# Patient Record
Sex: Male | Born: 1935 | Race: Black or African American | Hispanic: No | Marital: Single | Smoking: Never smoker
Health system: Southern US, Community
[De-identification: ages and names within clinical notes are randomized; demographics above are authoritative.]

## PROBLEM LIST (undated history)

## (undated) ENCOUNTER — Emergency Department (HOSPITAL_COMMUNITY): Disposition: A | Discharge status: Still In Hospital | Payer: Medicare PPO

## (undated) DIAGNOSIS — J309 Allergic rhinitis, unspecified: Secondary | ICD-10-CM

## (undated) DIAGNOSIS — M5030 Other cervical disc degeneration, unspecified cervical region: Secondary | ICD-10-CM

## (undated) DIAGNOSIS — I10 Essential (primary) hypertension: Secondary | ICD-10-CM

## (undated) DIAGNOSIS — K219 Gastro-esophageal reflux disease without esophagitis: Secondary | ICD-10-CM

## (undated) DIAGNOSIS — F0390 Unspecified dementia without behavioral disturbance: Secondary | ICD-10-CM

## (undated) HISTORY — DX: Unspecified dementia, unspecified severity, without behavioral disturbance, psychotic disturbance, mood disturbance, and anxiety: F03.90

## (undated) HISTORY — DX: Other cervical disc degeneration, unspecified cervical region: M50.30

## (undated) HISTORY — DX: Allergic rhinitis, unspecified: J30.9

## (undated) HISTORY — DX: Essential (primary) hypertension: I10

## (undated) HISTORY — DX: Gastro-esophageal reflux disease without esophagitis: K21.9

---

## 1999-03-30 ENCOUNTER — Encounter: Admission: RE | Admit: 1999-03-30 | Discharge: 1999-03-30 | Payer: Self-pay | Admitting: Cardiology

## 1999-03-30 ENCOUNTER — Encounter: Payer: Self-pay | Admitting: Cardiology

## 1999-04-14 ENCOUNTER — Encounter: Admission: RE | Admit: 1999-04-14 | Discharge: 1999-04-14 | Payer: Self-pay | Admitting: Cardiology

## 1999-04-14 ENCOUNTER — Encounter: Payer: Self-pay | Admitting: Cardiology

## 2000-02-20 ENCOUNTER — Emergency Department (HOSPITAL_COMMUNITY): Admission: EM | Admit: 2000-02-20 | Discharge: 2000-02-20 | Payer: Self-pay | Admitting: *Deleted

## 2000-06-27 ENCOUNTER — Encounter: Payer: Self-pay | Admitting: Cardiology

## 2000-06-27 ENCOUNTER — Ambulatory Visit (HOSPITAL_COMMUNITY): Admission: RE | Admit: 2000-06-27 | Discharge: 2000-06-27 | Payer: Self-pay | Admitting: Cardiology

## 2000-07-24 ENCOUNTER — Emergency Department (HOSPITAL_COMMUNITY): Admission: EM | Admit: 2000-07-24 | Discharge: 2000-07-24 | Payer: Self-pay | Admitting: Emergency Medicine

## 2000-08-03 ENCOUNTER — Encounter: Payer: Self-pay | Admitting: Cardiology

## 2000-08-03 ENCOUNTER — Ambulatory Visit (HOSPITAL_COMMUNITY): Admission: RE | Admit: 2000-08-03 | Discharge: 2000-08-03 | Payer: Self-pay | Admitting: Cardiology

## 2000-10-03 ENCOUNTER — Encounter: Payer: Self-pay | Admitting: Gastroenterology

## 2000-10-03 ENCOUNTER — Ambulatory Visit (HOSPITAL_COMMUNITY): Admission: RE | Admit: 2000-10-03 | Discharge: 2000-10-03 | Payer: Self-pay | Admitting: Gastroenterology

## 2000-12-30 ENCOUNTER — Encounter: Payer: Self-pay | Admitting: Orthopedic Surgery

## 2001-01-04 ENCOUNTER — Ambulatory Visit (HOSPITAL_COMMUNITY): Admission: RE | Admit: 2001-01-04 | Discharge: 2001-01-04 | Payer: Self-pay | Admitting: Orthopedic Surgery

## 2002-12-13 ENCOUNTER — Encounter: Admission: RE | Admit: 2002-12-13 | Discharge: 2002-12-13 | Payer: Self-pay | Admitting: Cardiology

## 2002-12-13 ENCOUNTER — Encounter: Payer: Self-pay | Admitting: Cardiology

## 2004-07-11 ENCOUNTER — Emergency Department (HOSPITAL_COMMUNITY): Admission: EM | Admit: 2004-07-11 | Discharge: 2004-07-11 | Payer: Self-pay | Admitting: Emergency Medicine

## 2006-12-08 ENCOUNTER — Emergency Department (HOSPITAL_COMMUNITY): Admission: EM | Admit: 2006-12-08 | Discharge: 2006-12-08 | Payer: Self-pay | Admitting: Emergency Medicine

## 2006-12-31 ENCOUNTER — Emergency Department (HOSPITAL_COMMUNITY): Admission: EM | Admit: 2006-12-31 | Discharge: 2006-12-31 | Payer: Self-pay | Admitting: Emergency Medicine

## 2007-09-01 ENCOUNTER — Emergency Department (HOSPITAL_COMMUNITY): Admission: EM | Admit: 2007-09-01 | Discharge: 2007-09-01 | Payer: Self-pay | Admitting: Emergency Medicine

## 2008-03-01 ENCOUNTER — Encounter: Payer: Self-pay | Admitting: Internal Medicine

## 2008-03-01 LAB — CONVERTED CEMR LAB
AST: 33 units/L
Alkaline Phosphatase: 88 units/L
CO2: 26 meq/L
Calcium: 9 mg/dL
Glucose, Bld: 98 mg/dL
HDL: 39 mg/dL
LDL Cholesterol: 114 mg/dL
Total Protein: 7.3 g/dL
Triglyceride fasting, serum: 179 mg/dL

## 2008-06-03 ENCOUNTER — Encounter: Payer: Self-pay | Admitting: Internal Medicine

## 2008-06-03 LAB — CONVERTED CEMR LAB
HCT: 35.2 %
Hemoglobin: 12.2 g/dL
LDL Cholesterol: 135 mg/dL
MCV: 89.8 fL
Platelets: 155 10*3/uL
RBC: 3.92 M/uL
RDW: 13.7 %
WBC: 5.6 10*3/uL

## 2008-09-01 ENCOUNTER — Emergency Department (HOSPITAL_COMMUNITY): Admission: EM | Admit: 2008-09-01 | Discharge: 2008-09-01 | Payer: Self-pay | Admitting: Emergency Medicine

## 2008-10-10 ENCOUNTER — Encounter: Payer: Self-pay | Admitting: Internal Medicine

## 2008-10-10 LAB — CONVERTED CEMR LAB
ALT: 18 units/L
Albumin: 4.2 g/dL
CO2: 27 meq/L
Calcium: 9.5 mg/dL
Chloride: 105 meq/L
Creatinine, Ser: 1.17 mg/dL
HCT: 35 %
LDL Cholesterol: 123 mg/dL
MCV: 87.3 fL
RDW: 13.8 %
Total Protein: 7.3 g/dL
Triglyceride fasting, serum: 151 mg/dL
WBC: 5.1 10*3/uL

## 2008-10-24 ENCOUNTER — Emergency Department (HOSPITAL_COMMUNITY): Admission: EM | Admit: 2008-10-24 | Discharge: 2008-10-25 | Payer: Self-pay | Admitting: Emergency Medicine

## 2008-12-11 ENCOUNTER — Encounter: Admission: RE | Admit: 2008-12-11 | Discharge: 2008-12-11 | Payer: Self-pay | Admitting: Orthopedic Surgery

## 2009-03-17 ENCOUNTER — Ambulatory Visit: Payer: Self-pay | Admitting: Internal Medicine

## 2009-03-17 DIAGNOSIS — I10 Essential (primary) hypertension: Secondary | ICD-10-CM | POA: Insufficient documentation

## 2009-03-17 DIAGNOSIS — J309 Allergic rhinitis, unspecified: Secondary | ICD-10-CM | POA: Insufficient documentation

## 2009-03-19 ENCOUNTER — Encounter: Payer: Self-pay | Admitting: Internal Medicine

## 2009-05-18 ENCOUNTER — Emergency Department (HOSPITAL_COMMUNITY): Admission: EM | Admit: 2009-05-18 | Discharge: 2009-05-18 | Payer: Self-pay | Admitting: Emergency Medicine

## 2009-05-19 ENCOUNTER — Ambulatory Visit: Payer: Self-pay | Admitting: Internal Medicine

## 2009-05-19 DIAGNOSIS — R1084 Generalized abdominal pain: Secondary | ICD-10-CM

## 2009-05-19 LAB — CONVERTED CEMR LAB
ALT: 25 units/L (ref 0–53)
AST: 36 units/L (ref 0–37)
Albumin: 3.9 g/dL (ref 3.5–5.2)
Alkaline Phosphatase: 76 units/L (ref 39–117)
BUN: 11 mg/dL (ref 6–23)
Bilirubin, Direct: 0 mg/dL (ref 0.0–0.3)
CO2: 30 meq/L (ref 19–32)
Calcium: 9.3 mg/dL (ref 8.4–10.5)
Chloride: 105 meq/L (ref 96–112)
Eosinophils Relative: 3.4 % (ref 0.0–5.0)
Hemoglobin: 12 g/dL — ABNORMAL LOW (ref 13.0–17.0)
Ketones, ur: NEGATIVE mg/dL
Lymphs Abs: 2.1 10*3/uL (ref 0.7–4.0)
MCHC: 32.1 g/dL (ref 30.0–36.0)
Monocytes Relative: 12.7 % — ABNORMAL HIGH (ref 3.0–12.0)
RBC: 3.91 M/uL — ABNORMAL LOW (ref 4.22–5.81)
Total Protein: 7.8 g/dL (ref 6.0–8.3)
Urine Glucose: NEGATIVE mg/dL
Urobilinogen, UA: 0.2 (ref 0.0–1.0)
pH: 7 (ref 5.0–8.0)

## 2009-05-22 ENCOUNTER — Ambulatory Visit: Payer: Self-pay | Admitting: Internal Medicine

## 2009-06-10 ENCOUNTER — Ambulatory Visit: Payer: Self-pay | Admitting: Internal Medicine

## 2009-06-10 DIAGNOSIS — M542 Cervicalgia: Secondary | ICD-10-CM

## 2009-06-10 DIAGNOSIS — R079 Chest pain, unspecified: Secondary | ICD-10-CM

## 2009-06-25 ENCOUNTER — Ambulatory Visit: Payer: Self-pay | Admitting: Internal Medicine

## 2009-08-01 ENCOUNTER — Ambulatory Visit: Payer: Self-pay | Admitting: Internal Medicine

## 2009-08-01 DIAGNOSIS — M1990 Unspecified osteoarthritis, unspecified site: Secondary | ICD-10-CM | POA: Insufficient documentation

## 2009-08-01 DIAGNOSIS — R634 Abnormal weight loss: Secondary | ICD-10-CM | POA: Insufficient documentation

## 2009-08-01 DIAGNOSIS — R1013 Epigastric pain: Secondary | ICD-10-CM

## 2009-08-04 ENCOUNTER — Ambulatory Visit: Payer: Self-pay | Admitting: Internal Medicine

## 2009-08-05 ENCOUNTER — Encounter: Payer: Self-pay | Admitting: Internal Medicine

## 2009-08-05 LAB — CONVERTED CEMR LAB
Alpha-2-Globulin: 10.5 % (ref 7.1–11.8)
Total Protein, Serum Electrophoresis: 7.3 g/dL (ref 6.0–8.3)

## 2009-08-07 LAB — CONVERTED CEMR LAB
Alpha 1, Urine: DETECTED % — AB
Beta, Urine: DETECTED % — AB
Gamma Globulin, Urine: DETECTED % — AB
Total Protein, Urine: 2.6 mg/dL

## 2009-08-11 ENCOUNTER — Encounter: Admission: RE | Admit: 2009-08-11 | Discharge: 2009-08-11 | Payer: Self-pay | Admitting: Cardiology

## 2009-08-26 ENCOUNTER — Ambulatory Visit: Payer: Self-pay | Admitting: Internal Medicine

## 2009-09-15 ENCOUNTER — Ambulatory Visit: Payer: Self-pay | Admitting: Internal Medicine

## 2009-09-15 DIAGNOSIS — F0390 Unspecified dementia without behavioral disturbance: Secondary | ICD-10-CM | POA: Insufficient documentation

## 2009-12-23 ENCOUNTER — Ambulatory Visit: Payer: Self-pay | Admitting: Internal Medicine

## 2009-12-23 DIAGNOSIS — M159 Polyosteoarthritis, unspecified: Secondary | ICD-10-CM

## 2009-12-23 DIAGNOSIS — K219 Gastro-esophageal reflux disease without esophagitis: Secondary | ICD-10-CM

## 2010-01-06 ENCOUNTER — Telehealth: Payer: Self-pay | Admitting: Internal Medicine

## 2010-02-27 ENCOUNTER — Encounter: Payer: Self-pay | Admitting: Internal Medicine

## 2010-02-27 ENCOUNTER — Ambulatory Visit
Admission: RE | Admit: 2010-02-27 | Discharge: 2010-02-27 | Payer: Self-pay | Source: Home / Self Care | Attending: Internal Medicine | Admitting: Internal Medicine

## 2010-02-27 ENCOUNTER — Other Ambulatory Visit: Payer: Self-pay | Admitting: Internal Medicine

## 2010-02-27 LAB — CBC WITH DIFFERENTIAL/PLATELET
Basophils Absolute: 0 10*3/uL (ref 0.0–0.1)
Basophils Relative: 0.3 % (ref 0.0–3.0)
Eosinophils Absolute: 0.2 10*3/uL (ref 0.0–0.7)
Eosinophils Relative: 4.7 % (ref 0.0–5.0)
HCT: 35.6 % — ABNORMAL LOW (ref 39.0–52.0)
Hemoglobin: 12.1 g/dL — ABNORMAL LOW (ref 13.0–17.0)
Lymphocytes Relative: 40.9 % (ref 12.0–46.0)
Lymphs Abs: 2.1 10*3/uL (ref 0.7–4.0)
MCHC: 34.1 g/dL (ref 30.0–36.0)
MCV: 93.4 fl (ref 78.0–100.0)
Monocytes Absolute: 0.6 10*3/uL (ref 0.1–1.0)
Monocytes Relative: 12 % (ref 3.0–12.0)
Neutro Abs: 2.1 10*3/uL (ref 1.4–7.7)
Neutrophils Relative %: 42.1 % — ABNORMAL LOW (ref 43.0–77.0)
Platelets: 176 10*3/uL (ref 150.0–400.0)
RBC: 3.81 Mil/uL — ABNORMAL LOW (ref 4.22–5.81)
RDW: 14 % (ref 11.5–14.6)
WBC: 5.1 10*3/uL (ref 4.5–10.5)

## 2010-02-27 LAB — HEPATIC FUNCTION PANEL
ALT: 47 U/L (ref 0–53)
AST: 47 U/L — ABNORMAL HIGH (ref 0–37)
Albumin: 3.7 g/dL (ref 3.5–5.2)
Alkaline Phosphatase: 96 U/L (ref 39–117)
Bilirubin, Direct: 0.1 mg/dL (ref 0.0–0.3)
Total Bilirubin: 0.5 mg/dL (ref 0.3–1.2)
Total Protein: 6.9 g/dL (ref 6.0–8.3)

## 2010-02-27 LAB — CARDIAC PANEL
CK-MB: 4.4 ng/mL — ABNORMAL HIGH (ref 0.3–4.0)
Relative Index: 1.3 calc (ref 0.0–2.5)
Total CK: 335 U/L — ABNORMAL HIGH (ref 7–232)

## 2010-02-27 LAB — BASIC METABOLIC PANEL
BUN: 17 mg/dL (ref 6–23)
CO2: 32 mEq/L (ref 19–32)
Calcium: 9.1 mg/dL (ref 8.4–10.5)
Chloride: 103 mEq/L (ref 96–112)
Creatinine, Ser: 1.2 mg/dL (ref 0.4–1.5)
GFR: 73.01 mL/min (ref >60.00)
Glucose, Bld: 80 mg/dL (ref 70–99)
Potassium: 4.2 mEq/L (ref 3.5–5.1)
Sodium: 142 mEq/L (ref 135–145)

## 2010-03-04 ENCOUNTER — Telehealth: Payer: Self-pay | Admitting: Internal Medicine

## 2010-03-06 ENCOUNTER — Encounter: Payer: Self-pay | Admitting: Internal Medicine

## 2010-03-09 ENCOUNTER — Telehealth (INDEPENDENT_AMBULATORY_CARE_PROVIDER_SITE_OTHER): Payer: Self-pay | Admitting: *Deleted

## 2010-03-10 ENCOUNTER — Ambulatory Visit: Admission: RE | Admit: 2010-03-10 | Discharge: 2010-03-10 | Payer: Self-pay | Source: Home / Self Care

## 2010-03-10 ENCOUNTER — Encounter: Payer: Self-pay | Admitting: Cardiology

## 2010-03-10 ENCOUNTER — Encounter (HOSPITAL_COMMUNITY)
Admission: RE | Admit: 2010-03-10 | Discharge: 2010-03-24 | Payer: Self-pay | Source: Home / Self Care | Attending: Internal Medicine | Admitting: Internal Medicine

## 2010-03-15 ENCOUNTER — Encounter: Payer: Self-pay | Admitting: Cardiology

## 2010-03-24 ENCOUNTER — Ambulatory Visit
Admission: RE | Admit: 2010-03-24 | Discharge: 2010-03-24 | Payer: Self-pay | Source: Home / Self Care | Attending: Internal Medicine | Admitting: Internal Medicine

## 2010-03-26 NOTE — Assessment & Plan Note (Signed)
Summary: pain in head and middle back/lb   Vital Signs:  Patient profile:   77 year old male Height:      70 inches (177.80 cm) Weight:      173.8 pounds (79 kg) O2 Sat:      97 % on Room air Temp:     98.5 degrees F (36.94 degrees C) oral Pulse rate:   82 / minute BP sitting:   118 / 68  (left arm) Cuff size:   regular  Vitals Entered By: Tomma Lightning (June 10, 2009 2:53 PM)  O2 Flow:  Room air CC: Pt states ongoing pain all over his back Is Patient Diabetic? No Pain Assessment Patient in pain? yes     Location: body Type: aching   Primary Care Provider:  Rowe Clack MD  CC:  Pt states ongoing pain all over his back.  History of Present Illness: prev eval 05/19/09 reviewed: Abdominal Pain      This is a 77 year old man who presents with Abdominal pain.  The symptoms began 2 weeks ago.  On a scale of 1 to 10, the intensity is described as a 3-4.  reports started as abd pain but pain will travel "with the blood" and now occurs in various places - right back, head and left knee most commonly affected areas- currently only mild pain but can get severe.  The patient denies nausea, vomiting, diarrhea, constipation, and hematochezia.  The location of the pain started in right lower quadrant.  The pain is described as intermittent and radiating to the back.  The patient denies the following symptoms: fever, dysuria, and chest pain.  The pain is worse with movement.  The pain is better with sleep.  Worried about sugar in blood??-no hx DM  still with same vague compliants - "pain racing around the body" - neck, head, chest, back and abd -  denies any pain now at this moment - no syncope or presyncope - no CP, no N/V no HA or vision change no trouble sleeping no GERD or reflux - not using any OTC meds - no fever or joint swelling-   Preventive Screening-Counseling & Management  Alcohol-Tobacco     Alcohol drinks/day: 0     Alcohol Counseling: not indicated; patient does  not drink     Smoking Status: never     Tobacco Counseling: not indicated; no tobacco use  Current Medications (verified): 1)  None  Allergies (verified): No Known Drug Allergies  Past History:  Past Medical History: Allergic rhinitis elevated blood pressure  Review of Systems  The patient denies weight loss, vision loss, hoarseness, chest pain, peripheral edema, melena, hematochezia, muscle weakness, difficulty walking, depression, and enlarged lymph nodes.    Physical Exam  General:  alert, well-developed, well-nourished, and cooperative to examination.    Lungs:  normal respiratory effort, no intercostal retractions or use of accessory muscles; normal breath sounds bilaterally - no crackles and no wheezes.    Heart:  normal rate, regular rhythm, no murmur, and no rub. BLE without edema. Neurologic:  alert & oriented X3 and cranial nerves II-XII symetrically intact.  strength normal in all extremities, sensation intact to light touch, and gait normal. speech fluent without dysarthria or aphasia; follows commands with good comprehension.  Psych:  Oriented X3, memory intact for recent and remote, normally interactive, good eye contact, not anxious appearing, not depressed appearing, and not agitated.      Impression & Recommendations:  Problem # 1:  NECK PAIN (ICD-723.1)  vague symptoms - similar to prior abd pan c/o - see below - check EKG r/o cardiac source - nonsp ST changes, no acute or ischemic change check plain film xray c-spine r/o DDD - try ultracet for ?arthritis symptoms - pt agrees to same and will call if symptoms change or progress for cont eval and tx labs reviewed from last visit - normal; also normal CT A/P 05/22/09 His updated medication list for this problem includes:    Tramadol-acetaminophen 37.5-325 Mg Tabs (Tramadol-acetaminophen) .Marland Kitchen... 1 by mouth every 6 hours as needed for arthritis pain  Orders: EKG w/ Interpretation (93000) T-2 View CXR  ([id number]) T-Cervicle Spine 2-3 Views [id number]) Prescription Created Electronically [id number])  Problem # 2:  ABDOMINAL PAIN, GENERALIZED (ICD-789.07)  exam benign and hx unrevealing - prior eval for CBC, Bmet, LFT and UA normal -  CT A/P unremarkable 3/31 - results reviewed  Orders: EKG w/ Interpretation (93000) T-2 View CXR ([id number])  Problem # 3:  CHEST PAIN UNSPECIFIED (ICD-786.50) see qabove - also check labs for cardiac source or DDimer and pursue CT chest if (+) Orders: EKG w/ Interpretation (93000) T-2 View CXR ([id number]) TLB-Cardiac Panel [id number]) T-D-Dimer Fibrin Derivatives Quantitive [id number]) Prescription Created Electronically [id number])  Complete Medication List: 1)  Tramadol-acetaminophen 37.5-325 Mg Tabs (Tramadol-acetaminophen) .Marland Kitchen.. 1 by mouth every 6 hours as needed for arthritis pain  Patient Instructions: 1)  it was good to see you today. 2)  EKG does not show any heart problems at this time 3)  more test(s) ordered today -labs and xrays - your results will be called to you in 48-72 hours from the time of test completion 4)  try ultracet for pain symptoms - your prescription has been electronically submitted to your pharmacy. Please take as directed. Contact our office if you believe you're having problems with the medication(s).  5)  Please schedule a follow-up appointment in 2-3 months, sooner if problems.  Prescriptions: TRAMADOL-ACETAMINOPHEN 37.5-325 MG TABS (TRAMADOL-ACETAMINOPHEN) 1 by mouth every 6 hours as needed for arthritis pain  #40 x 1   Entered and Authorized by:   Rowe Clack MD   Signed by:   Rowe Clack MD on 06/10/2009   Method used:   Electronically to        [facility] [phone]* (retail)       [address]       [place], [place]  [address]       Ph: [phone]       Fax: [phone]   [id number]

## 2010-03-26 NOTE — Assessment & Plan Note (Signed)
Summary: Cardiology Nuclear Testing  Nuclear Med Background Indications for Stress Test: Evaluation for Ischemia     Symptoms: Chest Pain, Chest Pain with Exertion    Nuclear Pre-Procedure Caffeine/Decaff Intake: None NPO After: 9:00 PM Lungs: clear IV 0.9% NS with Angio Cath: 20g     IV Site: R Antecubital IV Started by: Irven Baltimore, RN Chest Size (in) 42     Height (in): 67.5 Weight (lb): 157 BMI: 24.31 Tech Comments:  The patient has never walked on a  treadmill, changed to lexiscan due to memory loss.  Nuclear Med Study 1 or 2 day study:  1 day     Stress Test Type:  Carlton Adam Reading MD:  Kirk Ruths, MD     Referring MD:  V.Leschber Resting Radionuclide:  Technetium 52m Tetrofosmin     Resting Radionuclide Dose:  11.0 mCi  Stress Radionuclide:  Technetium 33m Tetrofosmin     Stress Radionuclide Dose:  33.0 mCi   Stress Protocol   Lexiscan: 0.4 mg   Stress Test Technologist:  Ileene Hutchinson, EMT-P     Nuclear Technologist:  Charlton Amor, CNMT  Rest Procedure  Myocardial perfusion imaging was performed at rest 45 minutes following the intravenous administration of Technetium 81m Tetrofosmin.  Stress Procedure  The patient received IV Lexiscan 0.4 mg over 15-seconds.  Technetium 52m Tetrofosmin injected at 30-seconds.  There were no significant changes with infusion/occ PVCs..  Quantitative spect images were obtained after a 45 minute delay.  QPS Raw Data Images:  Acquisition technically good; normal left ventricular size. Stress Images:  There is decreased uptake in the inferior wall Rest Images:  There is decreased uptake in the inferior wall. Subtraction (SDS):  No evidence of ischemia. Transient Ischemic Dilatation:  1.08  (Normal <1.22)  Lung/Heart Ratio:  0.30  (Normal <0.45)  Quantitative Gated Spect Images QGS EDV:  67 ml QGS ESV:  24 ml QGS EF:  63 % QGS cine images:  Normal wall motion.   Overall Impression  Exercise Capacity: Lexiscan  with no exercise. BP Response: Normal blood pressure response. Clinical Symptoms: No chest pain ECG Impression: No significant ST segment change suggestive of ischemia. Overall Impression: Normal lexiscan nuclear study with inferior thinning but no ischemia.  Appended Document: Cardiology Nuclear Testing  please call pt - his heart stress test looks normal - no heart problems causing his symptoms - no change rec by me - call or come for OV if continued problems - thanks Rowe Clack MD  March 11, 2010 9:39 AM   Clinical Lists Changes      Called pt no ansew LMOM RTC.Marland KitchenMarland Kitchen1/18/12@11 :48am/LMB   Notified pt with results.Marland KitchenMarland Kitchen1/18/12@4 :34pm/LMB

## 2010-03-26 NOTE — Progress Notes (Signed)
  Phone Note Outgoing Call   Call placed by: Matilde Haymaker RN,  March 09, 2010 3:33 PM Call placed to: Patient Reason for Call: Confirm/change Appt Summary of Call: Left message with information on [employer] Information Sheet (see scanned document for details).      Nuclear Med Background Indications for Stress Test: Evaluation for Ischemia     Symptoms: Chest Pain, Chest Pain with Exertion    Nuclear Pre-Procedure Height (in): 67.5

## 2010-03-26 NOTE — Assessment & Plan Note (Signed)
Summary: NEW MEDICARE PT--PKG--STC   Vital Signs:  Patient profile:   77 year old male Height:      70 inches (177.80 cm) Weight:      174.6 pounds (79.36 kg) BMI:     25.14 O2 Sat:      96 % on Room air Temp:     98.0 degrees F (36.67 degrees C) oral Pulse rate:   86 / minute BP sitting:   150 / 82  (left arm) Cuff size:   regular  Vitals Entered By: Tomma Lightning (March 17, 2009 1:21 PM)  O2 Flow:  Room air CC: New patient Is Patient Diabetic? No Pain Assessment Patient in pain? no        Primary Care Provider:  Rowe Clack MD  CC:  New patient.  History of Present Illness: new pt to me and to our practice - here to establish care - prev followed with dr. Montez Morita  patient is here today for annual physical. Patient feels well and has no complaints.   Preventive Screening-Counseling & Management  Alcohol-Tobacco     Alcohol drinks/day: 0     Alcohol Counseling: not indicated; patient does not drink     Smoking Status: never     Tobacco Counseling: not indicated; no tobacco use  Caffeine-Diet-Exercise     Diet Counseling: not indicated; diet is assessed to be healthy     Does Patient Exercise: yes     Exercise Counseling: not indicated; exercise is adequate     Depression Counseling: not indicated; screening negative for depression  Safety-Violence-Falls     Seat Belt Use: yes     Seat Belt Counseling: not indicated; patient wears seat belts     Firearms in the Home: firearms in the home     Firearm Counseling: not indicated; uses recommended firearm safety measures     Smoke Detectors: yes     Smoke Detector Counseling: n/a     Violence in the Home: no risk noted     Sexual Abuse: no     Fall Risk: low     Fall Risk Counseling: not indicated; no significant falls noted      Drug Use:  never.        Blood Transfusions:  no.    Current Medications (verified): 1)  None  Allergies (verified): No Known Drug Allergies  Past History:  Past  Medical History: Allergic rhinitis  Past Surgical History: none  Family History: older sister living - "she's good" parents and 4 siblings all expired - "natural death i think"  Social History: Never Smoked widowed, lives with his youngest son no alcohol retired from [employer] Smoking Status:  never Does Patient Exercise:  yes Therapist, art Use:  yes Fall Risk:  low Blood Transfusions:  no Drug Use:  never  Review of Systems       see HPI above. I have reviewed all other systems and they were negative.   Physical Exam  General:  alert, well-developed, well-nourished, and cooperative to examination.    Head:  Normocephalic and atraumatic without obvious abnormalities. No apparent alopecia or balding. Eyes:  vision grossly intact; pupils equal, round and reactive to light.  conjunctiva and lids normal.    Ears:  normal pinnae bilaterally, without erythema, swelling, or tenderness to palpation. TMs clear, without effusion, or cerumen impaction. Hearing minimally diminished bilaterally  Mouth:  teeth and gums in good repair; mucous membranes moist, without lesions or ulcers. oropharynx clear without exudate,  no erythema.  Lungs:  normal respiratory effort, no intercostal retractions or use of accessory muscles; normal breath sounds bilaterally - no crackles and no wheezes.    Heart:  normal rate, regular rhythm, no murmur, and no rub. BLE without edema. Abdomen:  soft, non-tender, normal bowel sounds, no distention; no masses and no appreciable hepatomegaly or splenomegaly.   Msk:  No deformity or scoliosis noted of thoracic or lumbar spine.   Neurologic:  alert & oriented X3 and cranial nerves II-XII symetrically intact.  strength normal in all extremities, sensation intact to light touch, and gait normal. speech fluent without dysarthria or aphasia; follows commands with good comprehension.  Skin:  no rashes, vesicles, ulcers, or erythema. No nodules or irregularity to palpation.    Psych:  Oriented X3, memory intact for recent and remote, normally interactive, good eye contact, not anxious appearing, not depressed appearing, and not agitated.      Impression & Recommendations:  Problem # 1:  ELEVATED BLOOD PRESSURE (ICD-796.2)  pt denies hx same -  will send for records to confirm prior blood pressure readings and plan to start meds if true "HTN"  (pt denies HTN and declines medications at this point in time - i've never been on pills and not gonna start now) check labs to check Cr, TSH to look for other problems that may be contrib to high BP readings BP today: 150/82  Instructed in low sodium diet (DASH Handout) and behavior modification.    Orders: TLB-Creatinine, Blood (82565-CREA) TLB-TSH (Thyroid Stimulating Hormone) (84443-TSH)  Problem # 2:  Preventive Health Care (ICD-V70.0) Patient has been counseled on age-appropriate routine health concerns for screening and prevention. These are reviewed and up-to-date. Immunizations are up-to-date or declined.  Risk factors for depressionare reviewed and negative. Patient cognitive function is screened today;  ADLs are reviewed and addressed as needed; functional ability and level of safety have been reviewed and are appropriate.  Patient Instructions: 1)  it was good to see you today.  2)  will send for records from dr. spruill to learn more about you and your past medical history - 3)  if your blood pressure has been high with dr. Montez Morita, we will be contacting you to start medication for blood pressure control - 4)  test(s) ordered today - your results will be posted on the phone tree for review in 48-72 hours from the time of test completion; if any changes need to be made or there are abnormal results, you will be contacted directly.  5)  Please schedule a follow-up appointment in 6 months, sooner if problems.

## 2010-03-26 NOTE — Assessment & Plan Note (Signed)
Summary: 3 mo rov /nws  RS'D PER PT/NWS   Vital Signs:  Patient profile:   77 year old male Height:      67.5 inches (171.45 cm) Weight:      158.4 pounds (72 kg) O2 Sat:      99 % on Room air Temp:     97.4 degrees F (36.33 degrees C) oral Pulse rate:   67 / minute BP sitting:   132 / 78  (left arm) Cuff size:   regular  Vitals Entered By: Tomma Lightning RMA (December 23, 2009 8:36 AM)  O2 Flow:  Room air CC: 3 month follow-up Is Patient Diabetic? No Pain Assessment Patient in pain? no        Primary Care Provider:  Rowe Clack MD  CC:  3 month follow-up.  History of Present Illness: seen by [facility] 07/2009: OV reviewed here for acute evaluation as dr Asa Lente not in the office;  pt is poor historian and often does not answer questions asked but seems tangential in thinking and ? memory dysfunction - here with c/o generalized pain but more specificially to the upper abd area b/c seems new and unusual, intermittent for approx "more than a month";  no actual pain today,  cannot really say if dull/sharp/achy or other;  overall mild,  no radiation, no n/vd or fever ,   no abd distension but has had some constipation recently;  denies blood in stool;  has not tried antacids;   continues to c/o "pain all over"   -  states arthritic pains but no specific swollen joints and current pain meds seem to help enough;   +signficant wt loss -lost from 190 to current over the past year. had recent routine labs, CXR and CT abd/pelvic april 2011 - neg.   Denies dysphagia or pain with swallowing.  Pt does recall he had GI eval with egd and colonscopy, but not recently, and cannot remember the name of GI, and GI MD name not documented since being here in the current EMR.   dementia - feels aricept is helping memory "if i get enough sleep"   Clinical Review Panels:  Lipid Management   Cholesterol:  190 (10/10/2008)   LDL (bad choesterol):  123 (10/10/2008)   HDL (good cholesterol):  37  (10/10/2008)   Triglycerides:  151 (10/10/2008)  CBC   WBC:  4.5 (05/19/2009)   RBC:  3.91 (05/19/2009)   Hgb:  12.0 (05/19/2009)   Hct:  37.5 (05/19/2009)   Platelets:  167.0 (05/19/2009)   MCV  95.8 (05/19/2009)   MCHC  32.1 (05/19/2009)   RDW  12.7 (05/19/2009)   PMN:  35.2 (05/19/2009)   Lymphs:  48.5 (05/19/2009)   Monos:  12.7 (05/19/2009)   Eosinophils:  3.4 (05/19/2009)   Basophil:  0.2 (05/19/2009)  Complete Metabolic Panel   Glucose:  86 (05/19/2009)   Sodium:  141 (05/19/2009)   Potassium:  4.4 (05/19/2009)   Chloride:  105 (05/19/2009)   CO2:  30 (05/19/2009)   BUN:  11 (05/19/2009)   Creatinine:  1.0 (05/19/2009)   Albumin:  3.9 (05/19/2009)   Total Protein:  7.8 (05/19/2009)   Calcium:  9.3 (05/19/2009)   Total Bili:  0.3 (05/19/2009)   Alk Phos:  76 (05/19/2009)   SGPT (ALT):  25 (05/19/2009)   SGOT (AST):  36 (05/19/2009)   Current Medications (verified): 1)  Tramadol-Acetaminophen 37.5-325 Mg Tabs (Tramadol-Acetaminophen) .Marland Kitchen.. 1 By Mouth Every 6 Hours As Needed For Arthritis  Pain 2)  Robaxin 500 Mg Tabs (Methocarbamol) .Marland Kitchen.. 1 By Mouth Three Times A Day As Needed For Muscle Spasms and Neck Pain 3)  Omeprazole 20 Mg Cpdr (Omeprazole) .... 2 By Mouth Once Daily 4)  Aricept 5 Mg Tabs (Donepezil Hcl) .Marland Kitchen.. 1 By Mouth At Bedtime  Allergies (verified): No Known Drug Allergies  Past History:  Past Medical History: Allergic rhinitis elevated blood pressure DDD - cervical spine dementia, mild  Review of Systems  The patient denies anorexia, fever, chest pain, and syncope.    Physical Exam  General:  alert, well-developed, well-nourished, and cooperative to examination.    Lungs:  normal respiratory effort, no intercostal retractions or use of accessory muscles; normal breath sounds bilaterally - no crackles and no wheezes.    Heart:  normal rate, regular rhythm, no murmur, and no rub. BLE without edema. Psych:  Oriented X3, memory intact for recent  and remote, normally interactive, good eye contact, not anxious appearing, not depressed appearing, and not agitated.      Impression & Recommendations:  Problem # 1:  MEMORY LOSS (ICD-780.93)  symptoms c/w progressive dementia -  seem stable on aricept started 08/2009-cont same  Problem # 2:  WEIGHT LOSS (ICD-783.21)  labs since jan 2011 reviewed including CT's - normal SPEP/UPEP 07/2009 also neg/normal no further labs needed at this time but recheck next visit if cont dropping weight  Problem # 3:  GENERALIZED OSTEOARTHROSIS UNSPECIFIED SITE (ICD-715.00)  His updated medication list for this problem includes:    Tramadol-acetaminophen 37.5-325 Mg Tabs (Tramadol-acetaminophen) .Marland Kitchen... 1 by mouth every 6 hours as needed for arthritis pain  Problem # 4:  GERD (ICD-530.81)  His updated medication list for this problem includes:    Omeprazole 20 Mg Cpdr (Omeprazole) .Marland Kitchen... 2 by mouth once daily  Labs Reviewed: Hgb: 12.0 (05/19/2009)   Hct: 37.5 (05/19/2009)  Complete Medication List: 1)  Tramadol-acetaminophen 37.5-325 Mg Tabs (Tramadol-acetaminophen) .Marland Kitchen.. 1 by mouth every 6 hours as needed for arthritis pain 2)  Robaxin 500 Mg Tabs (Methocarbamol) .Marland Kitchen.. 1 by mouth three times a day as needed for muscle spasms and neck pain 3)  Omeprazole 20 Mg Cpdr (Omeprazole) .... 2 by mouth once daily 4)  Aricept 5 Mg Tabs (Donepezil hcl) .Marland Kitchen.. 1 by mouth at bedtime  Patient Instructions: 1)  it was good to see you today. 2)  continue aricept for your memory - also continue robaxin and ultracet as ongoing for arthritis and omeprazole for stomach 3)  refills done on all medications 4)  Please schedule a follow-up appointment in 3-4 months, sooner if problems. will check labs next visit Prescriptions: ARICEPT 5 MG TABS (DONEPEZIL HCL) 1 by mouth at bedtime  #30 x 6   Entered and Authorized by:   Rowe Clack MD   Signed by:   Rowe Clack MD on 12/23/2009   Method used:    Electronically to        [facility] [phone]* (retail)       [place], [place]  [address]       Ph: [phone]       Fax: [phone]   [id number] ROBAXIN 500 MG TABS (METHOCARBAMOL) 1 by mouth three times a day as needed for muscle spasms and neck pain  #40 x 1   Entered and Authorized by:   Rowe Clack MD   Signed by:   Rowe Clack  MD on 12/23/2009   Method used:   Electronically to        [facility] (retail)       [place], [place]  [address]       Ph: [phone]       Fax: [phone]   RxID:   [id number] TRAMADOL-ACETAMINOPHEN 37.5-325 MG TABS (TRAMADOL-ACETAMINOPHEN) 1 by mouth every 6 hours as needed for arthritis pain  #40 x 1   Entered and Authorized by:   Rowe Clack MD   Signed by:   Rowe Clack MD on 12/23/2009   Method used:   Electronically to        [facility] [phone]* (retail)       [address]       [place], [place]  [address]       Ph: [phone]       Fax: [phone]   RxID:   [id number]    Orders Added: 1)  Est. Patient Level III [id number]

## 2010-03-26 NOTE — Progress Notes (Signed)
Summary: PA-Methocarbamol - change to generic zanaflex  Phone Note From Pharmacy   Summary of Call: PA-Methocarbamol, called human @ [phone], awaiting form. Raymond Hickman  March 04, 2010 4:11 PM Received form, form to Dr Asa Lente to complete. Raymond Hickman  March 04, 2010 4:13 PM PA Faxed to [phone], awaiting approval . Raymond Hickman  March 06, 2010 3:04 PM Insurance denied does not meet [facility] medical guidelines for coverage, copy of denial on desk. Initial call taken by: Raymond Hickman,  March 06, 2010 4:45 PM  Follow-up for Phone Call        insurance wants baclofen or tizanidine - will change robaxin to zanaflex (though i believe this is as or more sedating than robaxin - see my prior auth response 03/05/10) - erx zanaflex done Follow-up by: Rowe Clack MD,  March 06, 2010 4:57 PM    New/Updated Medications: ZANAFLEX 4 MG TABS (TIZANIDINE HCL) 1 by mouth three times a day as needed for muscle pain and spasms Prescriptions: ZANAFLEX 4 MG TABS (TIZANIDINE HCL) 1 by mouth three times a day as needed for muscle pain and spasms  #40 x 1   Entered and Authorized by:   Rowe Clack MD   Signed by:   Rowe Clack MD on 03/06/2010   Method used:   Electronically to        [facility] [phone]* (retail)       [address]       [place], [place]  [address]       Ph: [phone]       Fax: [phone]   RxIDQQ:[id number]

## 2010-03-26 NOTE — Assessment & Plan Note (Signed)
Summary: 6 mos f/u  #//cd   Vital Signs:  Patient profile:   76 year old male Height:      67.5 inches (171.45 cm) Weight:      164.4 pounds (74.73 kg) O2 Sat:      98 % on Room air Temp:     98.6 degrees F (37.00 degrees C) oral Pulse rate:   74 / minute BP sitting:   128 / 72  (left arm) Cuff size:   regular  Vitals Entered By: Tomma Lightning RMA (September 15, 2009 1:03 PM)  O2 Flow:  Room air CC: 6 month follow-up Is Patient Diabetic? No Pain Assessment Patient in pain? no        Primary Care Provider:  Rowe Clack MD  CC:  6 month follow-up.  History of Present Illness: seen by [facility] 07/2009: OV reviewed here for acute evaluation as dr Asa Lente not in the office;  pt is poor historian and often does not answer questions asked but seems tangential in thinking and ? memory dysfunction - here with c/o generalized pain but more specificially to the upper abd area b/c seems new and unusual, intermittent for approx "more than a month";  no actual pain today,  cannot really say if dull/sharp/achy or other;  overall mild,  no radiation, no n/vd or fever ,   no abd distension but has had some constipation recently;  denies blood in stool;  has not tried antacids;   continues to c/o "pain all over"   -  states arthritic pains but no specific swollen joints and current pain meds seem to help enough;   when asked why he is here today says "i dont know="  +signficant wt loss - 10 lbs by the chart since apr 19,   lost from 190 to current over the past year. Denies significant memory loss.   had recent routine labs, CXR and CT abd/pelvic april 2011 - neg.   Denies dysphagia or pain with swallowing.  Pt does recall he had GI eval with egd and colonscopy, but not recently, and cannot remember the name of GI, and GI MD name not documented since being here in the current EMR.   Clinical Review Panels:  Lipid Management   Cholesterol:  190 (10/10/2008)   LDL (bad choesterol):  123  (10/10/2008)   HDL (good cholesterol):  37 (10/10/2008)   Triglycerides:  151 (10/10/2008)  CBC   WBC:  4.5 (05/19/2009)   RBC:  3.91 (05/19/2009)   Hgb:  12.0 (05/19/2009)   Hct:  37.5 (05/19/2009)   Platelets:  167.0 (05/19/2009)   MCV  95.8 (05/19/2009)   MCHC  32.1 (05/19/2009)   RDW  12.7 (05/19/2009)   PMN:  35.2 (05/19/2009)   Lymphs:  48.5 (05/19/2009)   Monos:  12.7 (05/19/2009)   Eosinophils:  3.4 (05/19/2009)   Basophil:  0.2 (05/19/2009)  Complete Metabolic Panel   Glucose:  86 (05/19/2009)   Sodium:  141 (05/19/2009)   Potassium:  4.4 (05/19/2009)   Chloride:  105 (05/19/2009)   CO2:  30 (05/19/2009)   BUN:  11 (05/19/2009)   Creatinine:  1.0 (05/19/2009)   Albumin:  3.9 (05/19/2009)   Total Protein:  7.8 (05/19/2009)   Calcium:  9.3 (05/19/2009)   Total Bili:  0.3 (05/19/2009)   Alk Phos:  76 (05/19/2009)   SGPT (ALT):  25 (05/19/2009)   SGOT (AST):  36 (05/19/2009)   Current Medications (verified): 1)  Tramadol-Acetaminophen 37.5-325 Mg Tabs (Tramadol-Acetaminophen) .Marland KitchenMarland KitchenMarland Kitchen  1 By Mouth Every 6 Hours As Needed For Arthritis Pain 2)  Robaxin 500 Mg Tabs (Methocarbamol) .Marland Kitchen.. 1 By Mouth Three Times A Day As Needed For Muscle Spasms and Neck Pain 3)  Omeprazole 20 Mg Cpdr (Omeprazole) .... 2 By Mouth Once Daily  Allergies (verified): No Known Drug Allergies  Past History:  Past Medical History: Allergic rhinitis elevated blood pressure DDD - cervical spine dementia, mild  Review of Systems  The patient denies fever, weight loss, chest pain, syncope, and headaches.    Physical Exam  General:  alert, well-developed, well-nourished, and cooperative to examination.    Lungs:  normal respiratory effort, no intercostal retractions or use of accessory muscles; normal breath sounds bilaterally - no crackles and no wheezes.    Heart:  normal rate, regular rhythm, no murmur, and no rub. BLE without edema. Abdomen:  soft, non-tender, normal bowel sounds, no  distention; no masses and no appreciable hepatomegaly or splenomegaly.   Neurologic:  alert & oriented X3 and cranial nerves II-XII symetrically intact.  strength normal in all extremities, sensation intact to light touch, and gait normal. speech fluent without dysarthria or aphasia; follows commands with good comprehension.  Psych:  Oriented X3, memory intact for recent and remote, normally interactive, good eye contact, not anxious appearing, not depressed appearing, and not agitated.      Impression & Recommendations:  Problem # 1:  MEMORY LOSS (ICD-780.93)  symptoms c/w progressive dementia -  start aricept - erx done potential risk and benefit of med tx discussed and pt agrees to same  Orders: Prescription Created Electronically [phone])  Problem # 2:  WEIGHT LOSS (ICD-783.21)  labs since jan 2011 reviewed including CT's - normal SPEP/UPEP 07/2009 also neg/normal no further labs needed at this time  Complete Medication List: 1)  Tramadol-acetaminophen 37.5-325 Mg Tabs (Tramadol-acetaminophen) .Marland Kitchen.. 1 by mouth every 6 hours as needed for arthritis pain 2)  Robaxin 500 Mg Tabs (Methocarbamol) .Marland Kitchen.. 1 by mouth three times a day as needed for muscle spasms and neck pain 3)  Omeprazole 20 Mg Cpdr (Omeprazole) .... 2 by mouth once daily 4)  Aricept 5 Mg Tabs (Donepezil hcl) .Marland Kitchen.. 1 by mouth at bedtime  Patient Instructions: 1)  it was good to see you today. 2)  start aricept for your memory - your prescription has been electronically submitted to your pharmacy. Please take as directed. Contact our office if you believe you're having problems with the medication(s).  3)  no other medication changes - continue as ongoing for arthritis and for stomach 4)  Please schedule a follow-up appointment in 3-4 months, sooner if problems.  Prescriptions: ARICEPT 5 MG TABS (DONEPEZIL HCL) 1 by mouth at bedtime  #30 x 3   Entered and Authorized by:   Rowe Clack MD   Signed by:   Rowe Clack MD on 09/15/2009   Method used:   Electronically to        [facility] [phone]* (retail)       [address]       [place],   [address]       Ph: [phone]       Fax: [phone]   RxID:   [id number]

## 2010-03-26 NOTE — Assessment & Plan Note (Signed)
Summary: shoulder pain/cd   Vital Signs:  Patient profile:   77 year old male Height:      70 inches (177.80 cm) Weight:      168.2 pounds (76.45 kg) O2 Sat:      97 % on Room air Temp:     97.3 degrees F (36.28 degrees C) oral Pulse rate:   66 / minute BP sitting:   142 / 72  (left arm) Cuff size:   regular  Vitals Entered By: Tomma Lightning (Jun 25, 2009 1:11 PM)  O2 Flow:  Room air CC: Shoulder & Neck pain Is Patient Diabetic? No Pain Assessment Patient in pain? yes     Location: Shoulder & Neck Type: aching   Primary Care Provider:  Rowe Clack MD  CC:  Shoulder & Neck pain.  History of Present Illness: continued neck and shoulder pain- improved with ultracet -  prev eval 05/19/09 and 06/10/09 reviewed:  Abdominal Pain      This is a 77 year old man who presents with Abdominal pain.  The symptoms began 2 weeks ago.  On a scale of 1 to 10, the intensity is described as a 3-4.  reports started as abd pain but pain will travel "with the blood" and now occurs in various places - right back, head and left knee most commonly affected areas- currently only mild pain but can get severe.  The patient denies nausea, vomiting, diarrhea, constipation, and hematochezia.  The location of the pain started in right lower quadrant.  The pain is described as intermittent and radiating to the back.  The patient denies the following symptoms: fever, dysuria, and chest pain.  The pain is worse with movement.  The pain is better with sleep.  Worried about sugar in blood??-no hx DM  still with same vague compliants - "pain racing around the body" - neck, head, chest, back and abd -  denies any pain now at this moment - no syncope or presyncope - no CP, no N/V no HA or vision change no trouble sleeping no GERD or reflux - not using any OTC meds - no fever or joint swelling-   Current Medications (verified): 1)  Tramadol-Acetaminophen 37.5-325 Mg Tabs (Tramadol-Acetaminophen) .Marland Kitchen.. 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    Physical Exam  General:  alert, well-developed, well-nourished, and cooperative to examination.    Neck:  myofacial pain L>R pain in neck, paraspinal region Lungs:  normal respiratory effort, no intercostal retractions or use of accessory muscles; normal breath sounds bilaterally - no crackles and no wheezes.    Heart:  normal rate, regular rhythm, no murmur, and no rub. BLE without edema. Msk:  No deformity or scoliosis noted of thoracic or lumbar spine.   Neurologic:  alert & oriented X3 and cranial nerves II-XII symetrically intact.  strength normal in all extremities, sensation intact to light touch, and gait normal. speech fluent without dysarthria or aphasia; follows commands with good comprehension.  Psych:  Oriented X3, memory intact for recent and remote, normally interactive, good eye contact, not anxious appearing,  not depressed appearing, and not agitated.      Impression & Recommendations:  Problem # 1:  NECK PAIN (ICD-723.1)  dx reviewed and prior xrays 06/10/09 - will add muscle relaxants and refer for PT  His updated medication list for this problem includes:    Tramadol-acetaminophen 37.5-325 Mg Tabs (Tramadol-acetaminophen) .Marland Kitchen... 1 by mouth every 6 hours as needed for arthritis pain    Robaxin 500 Mg Tabs (Methocarbamol) .Marland Kitchen... 1 by mouth three times a day as needed for muscle spasms and neck pain  Orders: Prescription Created Electronically [phone]) Physical Therapy Referral (PT)  Complete Medication  List: 1)  Tramadol-acetaminophen 37.5-325 Mg Tabs (Tramadol-acetaminophen) .Marland Kitchen.. 1 by mouth every 6 hours as needed for arthritis pain 2)  Robaxin 500 Mg Tabs (Methocarbamol) .Marland Kitchen.. 1 by mouth three times a day as needed for muscle spasms and neck pain  Patient Instructions: 1)  it was good to see you today. 2)  for your neck pain and arthritis, will refer to physical therapy.  Our office will contact you regarding this appointment once made.  3)  continue tramadol-apap for pain as before and use new medication robaxin for muscle relaxant to help with muscle spasms and pain 4)  your prescriptions have been electronically submitted to your pharmacy. Please take as directed. Contact our office if you believe you're having problems with the medication(s).  5)  Please schedule a follow-up appointment as needed. Prescriptions: ROBAXIN 500 MG TABS (METHOCARBAMOL) 1 by mouth three times a day as needed for muscle spasms and neck pain  #40 x 1   Entered and Authorized by:   Rowe Clack MD   Signed by:   Rowe Clack MD on 06/25/2009   Method used:   Electronically to        [facility] [phone]* (retail)       [address]       [place], [place]  [address]       Ph: [phone]       Fax: [phone]   RxID:   [id number]

## 2010-03-26 NOTE — Medication Information (Signed)
Summary: [facility]   Imported By: Bubba Hales 03/11/2010 07:29:57  _____________________________________________________________________  External Attachment:    Type:   Image     Comment:   External Document

## 2010-03-26 NOTE — Assessment & Plan Note (Signed)
Summary: rib pain/[place]chber/cd   Vital Signs:  Patient profile:   77 year old male Height:      67.5 inches Weight:      161.50 pounds BMI:     25.01 O2 Sat:      98 % on Room air Temp:     97.9 degrees F oral Pulse rate:   67 / minute BP sitting:   110 / 60  (left arm) Cuff size:   regular  Vitals Entered ByShirlean Mylar Ewing (August 01, 2009 1:02 PM)  O2 Flow:  Room air  CC: abdominal pain/RE   Primary Care Provider:  Rowe Clack MD  CC:  abdominal pain/RE.  History of Present Illness: here for acute evaluation as dr Asa Lente not in the office;  pt is poor historian and often does not answer questions asked but seems tangential in thinking and ? memory dysfunction - here with c/o generalized pain but more specificially to the upper abd area b/c seems new and unusual, intermittent for approx "more than a month";  no actual pain today,  cannot really say if dull/sharp/achy or other;  overall mild,  no radiation, no n/vd or fever ,   no abd distension but has had some constipation recently;  denies blood in stool;  has not tried antacids;    also c/o "pain all over"   - states c/o arthritic pains but no specific swollen joints and current pain meds seem to help enough;  when asked why he is here today says "i dont know but this is the first time for the stomach pain." ,  but has signficant wt loss - 10 lbs by the chart since apr 19,  and pt states lost from 190 to current over the past year.  Denies significant memory loss.  had recent routine labs, CXR and CT abd/pelvic april 2011 - neg.  Denies dysphagia or pain with swallowing. Pt does recall he had GI eval with egd and colonscopy, but not recently, and cannot remember the name of GI, and GI MD name not documented since being here in the current EMR.   Problems Prior to Update: 1)  Weight Loss  (ICD-783.21) 2)  Abdominal Pain, Epigastric  (ICD-789.06) 3)  Chest Pain Unspecified  (ICD-786.50) 4)  Neck Pain  (ICD-723.1) 5)   Abdominal Pain, Generalized  (ICD-789.07) 6)  Preventive Health Care  (ICD-V70.0) 7)  Elevated Blood Pressure  (ICD-796.2) 8)  Allergic Rhinitis  (ICD-477.9)  Medications Prior to Update: 1)  Tramadol-Acetaminophen 37.5-325 Mg Tabs (Tramadol-Acetaminophen) .Marland Kitchen.. 1 By Mouth Every 6 Hours As Needed For Arthritis Pain 2)  Robaxin 500 Mg Tabs (Methocarbamol) .Marland Kitchen.. 1 By Mouth Three Times A Day As Needed For Muscle Spasms and Neck Pain  Current Medications (verified): 1)  Tramadol-Acetaminophen 37.5-325 Mg Tabs (Tramadol-Acetaminophen) .Marland Kitchen.. 1 By Mouth Every 6 Hours As Needed For Arthritis Pain 2)  Robaxin 500 Mg Tabs (Methocarbamol) .Marland Kitchen.. 1 By Mouth Three Times A Day As Needed For Muscle Spasms and Neck Pain 3)  Omeprazole 20 Mg Cpdr (Omeprazole) .... 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   Head:  normocephalic and atraumatic.   Eyes:  vision grossly intact, pupils equal, and pupils round.   Ears:  R ear normal and L ear normal.   Nose:  no external deformity and no nasal discharge.   Mouth:  no gingival abnormalities and pharynx pink and moist.   Neck:  supple and no masses.   Lungs:  normal respiratory effort and normal breath sounds.   Heart:  normal rate and regular rhythm.   Abdomen:  soft, non-tender, normal bowel  sounds, no distention, no guarding, no rebound tenderness, no hepatomegaly, and no splenomegaly.   Msk:  no joint tenderness and no joint swelling.  ;  pt points to shoulders as sites of pain but not specifically tender;  also had mild knee crepitus without tender or swelling Extremities:  no edema, no erythema    Impression & Recommendations:  Problem # 1:  ABDOMINAL PAIN, EPIGASTRIC (ICD-789.06) exam benign, with significant recent wt loss and pain but no dysphagia;  gave 2 wk sample nexium 40 once daily,  refer GI at [facility] - ? need EGD  Problem # 2:  WEIGHT LOSS (ICD-783.21)  recent labs since jan 2011 reviewed including CT's;  to check further labs  at this time  -  SPEP and UPEP  Orders: T-Serum Protein Electrophoresis [id number]) T- * Misc. Laboratory test [phone])  Problem # 3:  DEGENERATIVE JOINT DISEASE (ICD-715.90)  His updated medication list for this problem includes:    Tramadol-acetaminophen 37.5-325 Mg Tabs (Tramadol-acetaminophen) .Marland Kitchen... 1 by mouth every 6 hours as needed for arthritis pain presumed per pt hx, no films today; pt states pain controlled adequately - Continue all previous medications as before this visit   Complete Medication List: 1)  Tramadol-acetaminophen 37.5-325 Mg Tabs (Tramadol-acetaminophen) .Marland Kitchen.. 1 by mouth every 6 hours as needed for arthritis pain 2)  Robaxin 500 Mg Tabs (Methocarbamol) .Marland Kitchen.. 1 by mouth three times a day as needed for muscle spasms and neck pain 3)  Omeprazole 20 Mg Cpdr (Omeprazole) .... 2 by mouth once daily  Patient Instructions: 1)  please start the nexium at 40 mg per day (this should last 2 wks with the samples given); then change to the omeprazole 20 mg - TWO pills per day  (these are for stomach acid, to see if the abdominal pain can be improved) 2)  You will be contacted about the referral(s) to: GI at [facility] 3)  Please go to the Lab in the basement for your blood and/or urine tests today  4)  Continue all previous  medications as before this visit  5)  Please schedule an appointment with your primary doctor  in 2 weeks. Prescriptions: OMEPRAZOLE 20 MG CPDR (OMEPRAZOLE) 2 by mouth once daily  #180 x 3   Entered and Authorized by:   Biagio Borg MD   Signed by:   Biagio Borg MD on 08/01/2009   Method used:   Print then Give to Patient   RxID:   [id number]

## 2010-03-26 NOTE — Progress Notes (Signed)
  Phone Note Call from Patient   Caller: Patient [phone] Summary of Call: Pt called requesting Rx for cold sxs, congestion, runny nose, cough and ST. No fever. Please advise Initial call taken by: Crissie Sickles, [place],  January 06, 2010 9:26 AM  Follow-up for Phone Call        i rec tylenol cold and sinus OTC - directions as on box - also erx for tessalon to help cough - erx done - no abx, call for OV if worse- thanks Follow-up by: Rowe Clack MD,  January 06, 2010 9:57 AM  Additional Follow-up for Phone Call Additional follow up Details #1::        Pt informed and expressed understanding Additional Follow-up by: Crissie Sickles, [place],  January 06, 2010 11:06 AM    New/Updated Medications: TESSALON PERLES 100 MG CAPS (BENZONATATE) 1 by mouth three times a day as needed for cough symptoms Prescriptions: TESSALON PERLES 100 MG CAPS (BENZONATATE) 1 by mouth three times a day as needed for cough symptoms  #30 x 0   Entered and Authorized by:   Rowe Clack MD   Signed by:   Rowe Clack MD on 01/06/2010   Method used:   Electronically to        [facility] [phone]* (retail)       [address]       [place], [place]  [address]       Ph: [phone]       Fax: [phone]   RxIDTC:[id number]

## 2010-03-26 NOTE — Assessment & Plan Note (Signed)
Summary: CHEST PAIN/ GOES AWAY IF HE BURPS/ PER TRIAGE APPT TODAY/N[facility]   Vital Signs:  Patient profile:   77 year old male Height:      67.5 inches (171.45 cm) Weight:      159.8 pounds (72.64 kg) O2 Sat:      97 % on Room air Temp:     98.6 degrees F (37.00 degrees C) oral Pulse rate:   85 / minute BP sitting:   122 / 90  (left arm) Cuff size:   regular  Vitals Entered By: Tomma Lightning RMA (February 27, 2010 1:12 PM)  O2 Flow:  Room air CC: chest pain Is Patient Diabetic? No Pain Assessment Patient in pain? yes     Location: chest Onset of pain  pt states been having some chest discomfort not sure what causing it. when he burp it goes away. Denies pain in arms, no nausea   Primary Care Provider:  Rowe Clack MD  CC:  chest pain.  History of Present Illness:       This is a 77 year old male who presents with Chest pain.  The symptoms began 2 days ago.  On a scale of 1 to 10, the intensity is described as a 6.  no CP at this time. last episode was 4 hours ago. no known CAD.  The patient reports resting chest pain, exertional chest pain, and indigestion, but denies nausea, vomiting, diaphoresis, shortness of breath, palpitations, dizziness, light headedness, and syncope.  The pain is described as intermittent, pressure-like, and dull.  The pain is located in the substernal area and the pain does not radiate.  Episodes of chest pain last 1-2 minutes.  The pain is brought on or made worse by any activity and meals.  The pain is relieved or improved with eructation.   long hx of vaugue symptoms - pain in chest, abd, neck, back - prior OV reviewed similar symptoms in 05/2009 as now not sure if he takes PPI regularly as rx'd 12/2009   Preventive Screening-Counseling & Management  Alcohol-Tobacco     Alcohol drinks/day: 0     Alcohol Counseling: not indicated; patient does not drink     Smoking Status: never     Tobacco Counseling: not indicated; no tobacco use  Clinical  Review Panels:  Lipid Management   Cholesterol:  190 (10/10/2008)   LDL (bad choesterol):  123 (10/10/2008)   HDL (good cholesterol):  37 (10/10/2008)   Triglycerides:  151 (10/10/2008)  CBC   WBC:  4.5 (05/19/2009)   RBC:  3.91 (05/19/2009)   Hgb:  12.0 (05/19/2009)   Hct:  37.5 (05/19/2009)   Platelets:  167.0 (05/19/2009)   MCV  95.8 (05/19/2009)   MCHC  32.1 (05/19/2009)   RDW  12.7 (05/19/2009)   PMN:  35.2 (05/19/2009)   Lymphs:  48.5 (05/19/2009)   Monos:  12.7 (05/19/2009)   Eosinophils:  3.4 (05/19/2009)   Basophil:  0.2 (05/19/2009)  Complete Metabolic Panel   Glucose:  86 (05/19/2009)   Sodium:  141 (05/19/2009)   Potassium:  4.4 (05/19/2009)   Chloride:  105 (05/19/2009)   CO2:  30 (05/19/2009)   BUN:  11 (05/19/2009)   Creatinine:  1.0 (05/19/2009)   Albumin:  3.9 (05/19/2009)   Total Protein:  7.8 (05/19/2009)   Calcium:  9.3 (05/19/2009)   Total Bili:  0.3 (05/19/2009)   Alk Phos:  76 (05/19/2009)   SGPT (ALT):  25 (05/19/2009)   SGOT (AST):  36 (05/19/2009)   Current Medications (verified): 1)  Tramadol-Acetaminophen 37.5-325 Mg Tabs (Tramadol-Acetaminophen) .Marland Kitchen.. 1 By Mouth Every 6 Hours As Needed For Arthritis Pain 2)  Robaxin 500 Mg Tabs (Methocarbamol) .Marland Kitchen.. 1 By Mouth Three Times A Day As Needed For Muscle Spasms and Neck Pain 3)  Omeprazole 20 Mg Cpdr (Omeprazole) .... 2 By Mouth Once Daily 4)  Aricept 5 Mg Tabs (Donepezil Hcl) .Marland Kitchen.. 1 By Mouth At Bedtime 5)  Tessalon Perles 100 Mg Caps (Benzonatate) .Marland Kitchen.. 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    Physical Exam  General:  alert, well-developed, well-nourished, and cooperative to examination.    Eyes:  vision grossly intact, pupils equal, and pupils round.   Ears:  R ear normal and L ear normal.   Mouth:  no gingival abnormalities and pharynx pink and moist.   Neck:  supple and no masses.   Lungs:  normal respiratory effort, no intercostal retractions or use of accessory muscles; normal breath sounds bilaterally - no crackles and no wheezes.    Heart:  normal rate, regular rhythm, no murmur, and no rub. BLE without edema. Abdomen:  soft, non-tender, normal bowel sounds, no distention; no masses and no appreciable hepatomegaly or splenomegaly.     Impression & Recommendations:  Problem # 1:  CHEST PAIN UNSPECIFIED (ICD-786.50)  intermitt symptoms - check ekg now: reviewed and no acute ischemic change, no change from 06/10/09 ekg on file  labs and stress test - cont ppi for probable indigestion given relief of pain with burp  Orders: EKG w/ Interpretation (93000) TLB-CBC Platelet - w/Differential (85025-CBCD) TLB-BMP (Basic Metabolic Panel-BMET) (99991111) TLB-Hepatic/Liver Function Pnl (80076-HEPATIC) Cardiolite (Cardiolite) TLB-Cardiac Panel [id number]  Complete Medication List: 1)  Tramadol-acetaminophen 37.5-325 Mg Tabs (Tramadol-acetaminophen) .Marland Kitchen.. 1 by mouth every 6 hours as needed for arthritis pain 2)  Robaxin 500 Mg Tabs (Methocarbamol) .Marland Kitchen.. 1 by mouth three times a day as needed for muscle spasms and neck pain 3)  Omeprazole 20 Mg Cpdr (Omeprazole) .... 2 by mouth once daily 4)  Aricept 5 Mg Tabs (Donepezil hcl) .Marland Kitchen.. 1 by mouth at  bedtime 5)  Tessalon Perles 100 Mg Caps (Benzonatate) .Marland Kitchen.. 1 by mouth three times a day as needed for cough symptoms  Patient Instructions: 1)  it was good to see you today. 2)  test(s) ordered today - your results will be called to you after review in 48-72 hours from the time of test completion; if any changes need to be made or there are abnormal results, you will be contacted directly.  3)  we'll make referral for cardiac stress test. Our office will contact you regarding this appointment once made.  4)  if your symptoms continue to worsen (pain, trouble breathing or other problems), or if you are unable take anything by mouth (pills, fluids, etc), you  should go to the emergency room for further evaluation and treatment.    Orders Added: 1)  EKG w/ Interpretation [93000] 2)  TLB-CBC Platelet - w/Differential [85025-CBCD] 3)  TLB-BMP (Basic Metabolic Panel-BMET) [id number] 4)  TLB-Hepatic/Liver Function Pnl [80076-HEPATIC] 5)  Est. Patient Level IV [id number] 6)  Cardiolite [Cardiolite] 7)  TLB-Cardiac Panel [id number]

## 2010-03-26 NOTE — Assessment & Plan Note (Signed)
Summary: SIDE HURTING--FEEL LIKE SOMETHING RUNNING AROUND IN STOMACH/P...   Vital Signs:  Patient profile:   77 year old male Height:      70 inches (177.80 cm) Weight:      172.0 pounds (78.18 kg) O2 Sat:      97 % on Room air Temp:     97.3 degrees F (36.28 degrees C) oral Pulse rate:   72 / minute BP sitting:   140 / 82  (left arm) Cuff size:   large  Vitals Entered By: Tomma Lightning (May 19, 2009 1:21 PM)  O2 Flow:  Room air CC: Pain on (R) side, Abdominal pain Is Patient Diabetic? No Pain Assessment Patient in pain? yes     Location: (R) sude   Primary Care Provider:  Rowe Clack MD  CC:  Pain on (R) side and Abdominal pain.  History of Present Illness:  Abdominal Pain      This is a 77 year old man who presents with Abdominal pain.  The symptoms began 2 weeks ago.  On a scale of 1 to 10, the intensity is described as a 3-4.  reports started as abd pain but pain will travel "with the blood" and now occurs in various places - right back, head and left knee most commonly affected areas- currently only mild pain but can get severe.  The patient denies nausea, vomiting, diarrhea, constipation, and hematochezia.  The location of the pain started in right lower quadrant.  The pain is described as intermittent and radiating to the back.  The patient denies the following symptoms: fever, dysuria, and chest pain.  The pain is worse with movement.  The pain is better with sleep.  Worried about sugar in blood??-no hx DM  Clinical Review Panels:  CBC   WBC:  5.1 (10/10/2008)   RBC:  4.01 (10/10/2008)   Hgb:  12.3 (10/10/2008)   Hct:  35.0 (10/10/2008)   Platelets:  167 (10/10/2008)   MCV  87.3 (10/10/2008)   RDW  13.8 (10/10/2008)  Complete Metabolic Panel   Glucose:  112 (10/10/2008)   Sodium:  141 (10/10/2008)   Potassium:  3.9 (10/10/2008)   Chloride:  105 (10/10/2008)   CO2:  27 (10/10/2008)   BUN:  15 (10/10/2008)   Creatinine:  1.2 (03/17/2009)   Albumin:   4.2 (10/10/2008)   Total Protein:  7.3 (10/10/2008)   Calcium:  9.5 (10/10/2008)   Total Bili:  0.6 (10/10/2008)   Alk Phos:  73 (10/10/2008)   SGPT (ALT):  18 (10/10/2008)   SGOT (AST):  26 (10/10/2008)   Current Medications (verified): 1)  None  Allergies (verified): No Known Drug Allergies  Past History:  Past Medical History: Allergic rhinitis elevated blood pressure  Review of Systems  The patient denies syncope, dyspnea on exertion, peripheral edema, headaches, melena, hematochezia, severe indigestion/heartburn, hematuria, incontinence, muscle weakness, suspicious skin lesions, difficulty walking, and depression.    Physical Exam  General:  alert, well-developed, well-nourished, and cooperative to examination.    Eyes:  vision grossly intact; pupils equal, round and reactive to light.  conjunctiva and lids normal.    Neck:  supple, full ROM, no masses, no thyromegaly; no thyroid nodules or tenderness. no JVD or carotid bruits.   Lungs:  normal respiratory effort, no intercostal retractions or use of accessory muscles; normal breath sounds bilaterally - no crackles and no wheezes.    Heart:  normal rate, regular rhythm, no murmur, and no rub. BLE without edema. Abdomen:  soft, non-tender, normal bowel sounds, no distention; no masses and no appreciable hepatomegaly or splenomegaly.   Neurologic:  alert & oriented X3 and cranial nerves II-XII symetrically intact.  strength normal in all extremities, sensation intact to light touch, and gait normal. speech fluent without dysarthria or aphasia; follows commands with good comprehension.  Psych:  Oriented X3, memory intact for recent and remote, normally interactive, good eye contact, not anxious appearing, not depressed appearing, and not agitated.      Impression & Recommendations:  Problem # 1:  ABDOMINAL PAIN, GENERALIZED (ICD-789.07)  exam benign and hx unrevealing - screen for lab abn and ?urinary issues - as symptoms  only mild now, advise no rx med tx changes (pending lab review, may reconsider) to drink clears and adv as tol - Orders: TLB-BMP (Basic Metabolic Panel-BMET) (99991111) TLB-CBC Platelet - w/Differential (85025-CBCD) TLB-Hepatic/Liver Function Pnl (80076-HEPATIC) TLB-Udip w/ Micro (81001-URINE)  Discussed symptom control with the patient.   Problem # 2:  ELEVATED BLOOD PRESSURE (ICD-796.2) still declines med tx for HTN-  BP today: 140/82 Prior BP: 150/82 (03/17/2009)  Labs Reviewed: Creat: 1.2 (03/17/2009) Chol: 190 (10/10/2008)   HDL: 37 (10/10/2008)   LDL: 123 (10/10/2008)   TG: 151 (10/10/2008)  Instructed in low sodium diet (DASH Handout) and behavior modification.    Patient Instructions: 1)  it was good to see you today. 2)  test(s) ordered today - your results will be posted on the phone tree for review in 48-72 hours from the time of test completion; call [phone] and enter your 9 digit MRN (listed above on this page, just below your name); if any changes need to be made or there are abnormal results, you will be contacted directly.  3)  Drink clear liquids only for the next 24 hours, then slowly add other liquids and food as you  tolerate them. 4)  Please schedule a follow-up appointment in 6 months, sooner if problems.

## 2010-04-01 NOTE — Assessment & Plan Note (Signed)
Summary: 3-4 MTH FU  STC  RS'D FROM BUMP/NWS   Vital Signs:  Patient profile:   77 year old male Height:      67.5 inches (171.45 cm) Weight:      159.8 pounds (72.64 kg) O2 Sat:      97 % on Room air Temp:     98.3 degrees F (36.83 degrees C) oral Pulse rate:   67 / minute BP sitting:   122 / 72  (left arm) Cuff size:   regular  Vitals Entered By: Tomma Lightning RMA (March 24, 2010 1:04 PM)  O2 Flow:  Room air CC: 3 month follow-up Is Patient Diabetic? No Pain Assessment Patient in pain? no        Primary Care Provider:  Rowe Clack MD  CC:  3 month follow-up.  History of Present Illness:       This is a 77 year old male who presents with Chest pain.  The symptoms began 2 days ago.  On a scale of 1 to 10, the intensity is described as a 6.  no CP at this time. last episode was 4 hours ago. no known CAD.  The patient reports resting chest pain, exertional chest pain, and indigestion, but denies nausea, vomiting, diaphoresis, shortness of breath, palpitations, dizziness, light headedness, and syncope.  The pain is described as intermittent, pressure-like, and dull.  The pain is located in the substernal area and the pain does not radiate.  Episodes of chest pain last 1-2 minutes.  The pain is brought on or made worse by any activity and meals.  The pain is relieved or improved with eructation.   long hx of vaugue symptoms - pain in chest, abd, neck, back - prior OV reviewed similar symptoms in 05/2009 as now not sure if he takes PPI regularly as rx'd 12/2009   Clinical Review Panels:  Lipid Management   Cholesterol:  190 (10/10/2008)   LDL (bad choesterol):  123 (10/10/2008)   HDL (good cholesterol):  37 (10/10/2008)   Triglycerides:  151 (10/10/2008)  CBC   WBC:  5.1 (02/27/2010)   RBC:  3.81 (02/27/2010)   Hgb:  12.1 (02/27/2010)   Hct:  35.6 (02/27/2010)   Platelets:  176.0 (02/27/2010)   MCV  93.4 (02/27/2010)   MCHC  34.1 (02/27/2010)   RDW  14.0  (02/27/2010)   PMN:  42.1 (02/27/2010)   Lymphs:  40.9 (02/27/2010)   Monos:  12.0 (02/27/2010)   Eosinophils:  4.7 (02/27/2010)   Basophil:  0.3 (02/27/2010)  Complete Metabolic Panel   Glucose:  80 (02/27/2010)   Sodium:  142 (02/27/2010)   Potassium:  4.2 (02/27/2010)   Chloride:  103 (02/27/2010)   CO2:  32 (02/27/2010)   BUN:  17 (02/27/2010)   Creatinine:  1.2 (02/27/2010)   Albumin:  3.7 (02/27/2010)   Total Protein:  6.9 (02/27/2010)   Calcium:  9.1 (02/27/2010)   Total Bili:  0.5 (02/27/2010)   Alk Phos:  96 (02/27/2010)   SGPT (ALT):  47 (02/27/2010)   SGOT (AST):  47 (02/27/2010)   Current Medications (verified): 1)  Tramadol-Acetaminophen 37.5-325 Mg Tabs (Tramadol-Acetaminophen) .Marland Kitchen.. 1 By Mouth Every 6 Hours As Needed For Arthritis Pain 2)  Zanaflex 4 Mg Tabs (Tizanidine Hcl) .Marland Kitchen.. 1 By Mouth Three Times A Day As Needed For Muscle Pain and Spasms 3)  Omeprazole 20 Mg Cpdr (Omeprazole) .... 2 By Mouth Once Daily 4)  Aricept 5 Mg Tabs (Donepezil Hcl) .Marland Kitchen.. 1 By Mouth  At Bedtime  Allergies (verified): No Known Drug Allergies  Past History:  Past Medical History: Allergic rhinitis elevated blood pressure DDD - cervical spine dementia, mild    Review of Systems  The patient denies fever, weight loss, syncope, and headaches.    Physical Exam  General:  alert, well-developed, well-nourished, and cooperative to examination.    Lungs:  normal respiratory effort, no intercostal retractions or use of accessory muscles; normal breath sounds bilaterally - no crackles and no wheezes.    Heart:  normal rate, regular rhythm, no murmur, and no rub. BLE without edema. Neurologic:  alert & oriented X3 and cranial nerves II-XII symetrically intact.  strength normal in all extremities, sensation intact to light touch, and gait normal. speech fluent without dysarthria or aphasia; follows commands with good comprehension.  Psych:  Oriented X3, memory intact for recent and  remote, normally interactive, good eye contact, not anxious appearing, not depressed appearing, and not agitated.      Impression & Recommendations:  Problem # 1:  GENERALIZED OSTEOARTHROSIS UNSPECIFIED SITE (ICD-715.00)  His updated medication list for this problem includes:    Tramadol-acetaminophen 37.5-325 Mg Tabs (Tramadol-acetaminophen) .Marland Kitchen... 1 by mouth every 6 hours as needed for arthritis pain  Discussed use of medications, application of heat or cold, and exercises.   Problem # 2:  MEMORY LOSS (ICD-780.93)  symptoms c/w progressive dementia -  stable on aricept started 08/2009-cont same  Problem # 3:  GERD (ICD-530.81)  His updated medication list for this problem includes:    Omeprazole 20 Mg Cpdr (Omeprazole) .Marland Kitchen... 2 by mouth once daily  Labs Reviewed: Hgb: 12.1 (02/27/2010)   Hct: 35.6 (02/27/2010)  Complete Medication List: 1)  Tramadol-acetaminophen 37.5-325 Mg Tabs (Tramadol-acetaminophen) .Marland Kitchen.. 1 by mouth every 6 hours as needed for arthritis pain 2)  Zanaflex 4 Mg Tabs (Tizanidine hcl) .Marland Kitchen.. 1 by mouth three times a day as needed for muscle pain and spasms 3)  Omeprazole 20 Mg Cpdr (Omeprazole) .... 2 by mouth once daily 4)  Aricept 5 Mg Tabs (Donepezil hcl) .Marland Kitchen.. 1 by mouth at bedtime  Patient Instructions: 1)  it was good to see you today. 2)  medications reviewed - no changes today - call if refills needed before your next visit 3)  Please schedule a follow-up appointment in 3-4 months, sooner if problems.    Orders Added: 1)  Est. Patient Level III [id number]

## 2010-05-11 ENCOUNTER — Ambulatory Visit (INDEPENDENT_AMBULATORY_CARE_PROVIDER_SITE_OTHER): Payer: Medicare PPO | Admitting: Internal Medicine

## 2010-05-11 ENCOUNTER — Encounter: Payer: Self-pay | Admitting: Internal Medicine

## 2010-05-11 DIAGNOSIS — R079 Chest pain, unspecified: Secondary | ICD-10-CM

## 2010-05-21 NOTE — Assessment & Plan Note (Signed)
Summary: chest pain,-ok dahlia/cd   Vital Signs:  Patient profile:   77 year old male O2 Sat:      97 % on Room air Pulse rate:   72 / minute BP sitting:   128 / 80  (left arm) Cuff size:   regular  Vitals Entered By: Tomma Lightning RMA (May 11, 2010 3:52 PM)  O2 Flow:  Room air CC: Dry cough, Headache Is Patient Diabetic? No Pain Assessment Patient in pain? no      Comments Pt states he has been having alot of Dry cough lately. He states ? Whooping cough. When he cough he saids it makes his chest hurt. Denies any numbness or pain down his arms, No SOB   Primary Care Provider:  Rowe Clack MD  CC:  Dry cough and Headache.  History of Present Illness:       This is a 77 year old male who presents with Chest pain.  The symptoms began 01/2010.  On a scale of 1 to 10, the intensity variable, described as a 2-6.  no CP at this time. last episode was  at lunch (2-3 h ago). no known CAD.  The patient reports resting chest pain, exertional chest pain, and indigestion, but denies nausea, vomiting, diaphoresis, shortness of breath, palpitations, dizziness, light headedness, and syncope.  The pain is described as intermittent, pressure-like, and dull.  The pain is located in the substernal area and the pain does not radiate.  Episodes of chest pain last 1-2 minutes.  The pain is brought on or made worse by coughing and meals.  The pain is relieved or improved with eructation.   long hx of vaugue symptoms - pain in chest, abd, neck, back - prior OV reviewed similar symptoms in 05/2009 as now - xtay neck and chest unremrkable not sure if he takes PPI regularly as rx'd 12/2009   nuc stress test 02/2010 reviewed - no ischemic change  Clinical Review Panels:  CBC   WBC:  5.1 (02/27/2010)   RBC:  3.81 (02/27/2010)   Hgb:  12.1 (02/27/2010)   Hct:  35.6 (02/27/2010)   Platelets:  176.0 (02/27/2010)   MCV  93.4 (02/27/2010)   MCHC  34.1 (02/27/2010)   RDW  14.0 (02/27/2010)   PMN:   42.1 (02/27/2010)   Lymphs:  40.9 (02/27/2010)   Monos:  12.0 (02/27/2010)   Eosinophils:  4.7 (02/27/2010)   Basophil:  0.3 (02/27/2010)  Complete Metabolic Panel   Glucose:  80 (02/27/2010)   Sodium:  142 (02/27/2010)   Potassium:  4.2 (02/27/2010)   Chloride:  103 (02/27/2010)   CO2:  32 (02/27/2010)   BUN:  17 (02/27/2010)   Creatinine:  1.2 (02/27/2010)   Albumin:  3.7 (02/27/2010)   Total Protein:  6.9 (02/27/2010)   Calcium:  9.1 (02/27/2010)   Total Bili:  0.5 (02/27/2010)   Alk Phos:  96 (02/27/2010)   SGPT (ALT):  47 (02/27/2010)   SGOT (AST):  47 (02/27/2010)   Current Medications (verified): 1)  Tramadol-Acetaminophen 37.5-325 Mg Tabs (Tramadol-Acetaminophen) .Marland Kitchen.. 1 By Mouth Every 6 Hours As Needed For Arthritis Pain 2)  Zanaflex 4 Mg Tabs (Tizanidine Hcl) .Marland Kitchen.. 1 By Mouth Three Times A Day As Needed For Muscle Pain and Spasms 3)  Omeprazole 20 Mg Cpdr (Omeprazole) .... 2 By Mouth Once Daily 4)  Aricept 5 Mg Tabs (Donepezil Hcl) .Marland Kitchen.. 1 By Mouth At Bedtime 5)  Benzonatate 100 Mg Caps (Benzonatate) .... Take 1 Three Times A  Day As Needed For Cough  Allergies (verified): No Known Drug Allergies  Past History:  Past Medical History: Allergic rhinitis elevated blood pressure DDD - cervical spine  dementia, mild    Review of Systems  The patient denies fever, syncope, and hemoptysis.    Physical Exam  General:  alert, well-developed, well-nourished, and cooperative to examination.    Chest Wall:  No deformities, masses, tenderness or gynecomastia noted. Lungs:  normal respiratory effort, no intercostal retractions or use of accessory muscles; normal breath sounds bilaterally - no crackles and no wheezes.    Heart:  normal rate, regular rhythm, no murmur, and no rub. BLE without edema. Psych:  Oriented X3, memory intact for recent and remote, normally interactive, good eye contact, not anxious appearing, not depressed appearing, and not agitated.       Impression & Recommendations:  Problem # 1:  CHEST PAIN UNSPECIFIED (ICD-786.50)  intermitt symptoms - nuc stress test w/o isch 02/2010 cont ppi for probable indigestion given relief of pain with burp also use cough suppressant - hydromet and mucinex cxr 05/2009 w/o abn  Complete Medication List: 1)  Tramadol-acetaminophen 37.5-325 Mg Tabs (Tramadol-acetaminophen) .Marland Kitchen.. 1 by mouth every 6 hours as needed for arthritis pain 2)  Zanaflex 4 Mg Tabs (Tizanidine hcl) .Marland Kitchen.. 1 by mouth three times a day as needed for muscle pain and spasms 3)  Omeprazole 20 Mg Cpdr (Omeprazole) .... 2 by mouth once daily 4)  Aricept 5 Mg Tabs (Donepezil hcl) .Marland Kitchen.. 1 by mouth at bedtime 5)  Benzonatate 100 Mg Caps (Benzonatate) .... Take 1 three times a day as needed for cough 6)  Mucinex 600 Mg Xr12h-tab (Guaifenesin) .Marland Kitchen.. 1 by mouth two times a day as needed for cough 7)  Hydromet 5-1.5 Mg/62ml Syrp (Hydrocodone-homatropine) .... 5 cc by mouth every 6 hours as needed for cough - esp at bedtime - may cause sedation   Patient Instructions: 1)  it was good to see you today. 2)  medications reviewed - use cough syrup and mucinex for cough to control pain symptoms  - your prescription syrup has been submitted to your pharmacy. Please take as directed. Contact our office if you believe you're having problems with the medication(s).  3)  Please schedule a follow-up appointment in 3-4 months, call sooner if problems.  Prescriptions: HYDROMET 5-1.5 MG/5ML SYRP (HYDROCODONE-HOMATROPINE) 5 cc by mouth every 6 hours as needed for cough - esp at bedtime - may cause sedation  #6 oz x 0   Entered and Authorized by:   Rowe Clack MD   Signed by:   Rowe Clack MD on 05/11/2010   Method used:   Printed then faxed to ...       [facility] [phone]* (retail)       [place], [place]  [id number]       Ph: [phone]       Fax: [phone]   RxID:   [id number]    Orders Added: 1)   Est. Patient Level IV [id number]

## 2010-05-29 LAB — POCT CARDIAC MARKERS
CKMB, poc: 1.1 ng/mL (ref 1.0–8.0)
CKMB, poc: 4.7 ng/mL (ref 1.0–8.0)
Troponin i, poc: <0.05 ng/mL (ref 0.00–0.09)

## 2010-05-29 LAB — COMPREHENSIVE METABOLIC PANEL
ALT: 27 U/L (ref 0–53)
Alkaline Phosphatase: 76 U/L (ref 39–117)
BUN: 14 mg/dL (ref 6–23)
CO2: 30 mEq/L (ref 19–32)
Calcium: 9.4 mg/dL (ref 8.4–10.5)
Chloride: 106 mEq/L (ref 96–112)
Creatinine, Ser: 1.26 mg/dL (ref 0.4–1.5)
GFR calc non Af Amer: 56 mL/min — ABNORMAL LOW (ref >60)
Total Bilirubin: 0.7 mg/dL (ref 0.3–1.2)

## 2010-05-29 LAB — DIFFERENTIAL
Basophils Relative: 0 % (ref 0–1)
Eosinophils Absolute: 0.1 10*3/uL (ref 0.0–0.7)
Eosinophils Relative: 2 % (ref 0–5)
Lymphocytes Relative: 30 % (ref 12–46)
Lymphs Abs: 2.3 10*3/uL (ref 0.7–4.0)
Neutrophils Relative %: 59 % (ref 43–77)

## 2010-05-29 LAB — LIPASE, BLOOD: Lipase: 15 U/L (ref 11–59)

## 2010-05-29 LAB — CBC
HCT: 35.1 % — ABNORMAL LOW (ref 39.0–52.0)
Platelets: 157 10*3/uL (ref 150–400)
RDW: 13.8 % (ref 11.5–15.5)
WBC: 7.7 10*3/uL (ref 4.0–10.5)

## 2010-05-31 LAB — BASIC METABOLIC PANEL
BUN: 10 mg/dL (ref 6–23)
CO2: 30 mEq/L (ref 19–32)
Creatinine, Ser: 1 mg/dL (ref 0.4–1.5)
GFR calc Af Amer: >60 mL/min (ref >60)
GFR calc non Af Amer: >60 mL/min (ref >60)
Potassium: 3.4 mEq/L — ABNORMAL LOW (ref 3.5–5.1)

## 2010-05-31 LAB — DIFFERENTIAL
Basophils Relative: 0 % (ref 0–1)
Lymphocytes Relative: 30 % (ref 12–46)
Lymphs Abs: 1.6 10*3/uL (ref 0.7–4.0)
Monocytes Absolute: 0.4 10*3/uL (ref 0.1–1.0)
Neutro Abs: 3.4 10*3/uL (ref 1.7–7.7)

## 2010-05-31 LAB — CBC
HCT: 36.8 % — ABNORMAL LOW (ref 39.0–52.0)
Hemoglobin: 12.6 g/dL — ABNORMAL LOW (ref 13.0–17.0)
Platelets: 157 10*3/uL (ref 150–400)
RBC: 4 MIL/uL — ABNORMAL LOW (ref 4.22–5.81)
RDW: 13.9 % (ref 11.5–15.5)

## 2010-05-31 LAB — URINALYSIS, ROUTINE W REFLEX MICROSCOPIC
Glucose, UA: NEGATIVE mg/dL
Hgb urine dipstick: NEGATIVE
Nitrite: NEGATIVE
Urobilinogen, UA: 1 mg/dL (ref 0.0–1.0)

## 2010-06-08 ENCOUNTER — Other Ambulatory Visit: Payer: Self-pay | Admitting: Internal Medicine

## 2010-06-19 ENCOUNTER — Other Ambulatory Visit (HOSPITAL_COMMUNITY): Payer: Self-pay | Admitting: Cardiology

## 2010-06-22 ENCOUNTER — Other Ambulatory Visit: Payer: Self-pay | Admitting: Internal Medicine

## 2010-07-02 ENCOUNTER — Other Ambulatory Visit: Payer: Self-pay | Admitting: Internal Medicine

## 2010-07-06 ENCOUNTER — Ambulatory Visit (HOSPITAL_COMMUNITY): Payer: Medicare PPO

## 2010-07-06 ENCOUNTER — Ambulatory Visit (HOSPITAL_COMMUNITY): Admission: RE | Admit: 2010-07-06 | Payer: Medicare PPO | Source: Ambulatory Visit

## 2010-07-06 ENCOUNTER — Encounter (HOSPITAL_COMMUNITY)
Admission: RE | Admit: 2010-07-06 | Discharge: 2010-07-06 | Disposition: A | Discharge status: Home / Self Care | Payer: Medicare PPO | Source: Ambulatory Visit | Attending: Cardiology | Admitting: Cardiology

## 2010-07-06 DIAGNOSIS — R079 Chest pain, unspecified: Secondary | ICD-10-CM | POA: Insufficient documentation

## 2010-07-06 DIAGNOSIS — R0602 Shortness of breath: Secondary | ICD-10-CM | POA: Insufficient documentation

## 2010-07-06 MED ORDER — TECHNETIUM TC 99M TETROFOSMIN IV KIT
30.0000 | PACK | Freq: Once | INTRAVENOUS | Status: AC | PRN
  Administered 2010-07-06: 30 via INTRAVENOUS

## 2010-07-06 MED ORDER — TECHNETIUM TC 99M TETROFOSMIN IV KIT
10.0000 | PACK | Freq: Once | INTRAVENOUS | Status: AC | PRN
  Administered 2010-07-06: 10 via INTRAVENOUS

## 2010-07-10 NOTE — Op Note (Signed)
[place]  Patient:    Raymond Hickman, Raymond Hickman Visit Number: [id number] MRN: [id number]          Service Type: DSU Location: [facility] 28[address] 53 Attending Physician:  Mayme Genta Dictated by:   Sharmon Revere, M.D. Proc. Date: 01/04/01 Admit Date:  01/04/2001                             Operative Report  PREOPERATIVE DIAGNOSIS:  Internal derangement left knee.  POSTOPERATIVE DIAGNOSIS:  Chronic synovitis, lateral meniscal tear.  ANESTHESIA:  General.  PROCEDURE: 1. Arthroscopic lateral meniscectomy. 2. Excision of plica and partial synovectomy.  DESCRIPTION OF PROCEDURE:  The patient was taken to the operating room and after given adequate preoperative medication, given general anesthesia and intubated.  The left knee was prepped with duraprep and draped in a sterile manner.  Tourniquet used for hemostasis.  A 1/2 inch puncture wound was made along the anterior medial and lateral joint line.  Inflow portals through the medial suprapatellar pouch area.  Inspection of the joint revealed complete tear of the lateral meniscus with locking into the joint itself, and was pressed up against the anterior cruciate ligament.  Also, hemorrhagic synovium with thickened plica anteriorly both medial and lateral compartments.  With basket forceps and meniscal shaver, lateral meniscectomy was done followed by synovectomy, resection of the plica and synovial resection in both the medial and lateral compartment.  Copious irrigation was done.  Wound closure was then done with 4-0 nylon. Then 13 cc. of 0.50% plain Marcaine was injected into the joint.  Compressive dressing was applied.  The patient tolerated the procedure quite well and went to the recovery room in stable and satisfactory condition.  The patient is being discharged home on Percocet 1 q.4h. p.r.n. for pain, ice pack, elevation.  Partial weightbearing with use of crutches.  Return to  the office in 1 week.  The patient is being discharged in stable satisfactory condition. Dictated by:   Sharmon Revere, M.D. Attending Physician:  Mayme Genta DD:  01/04/01 TD:  01/04/01 Job: [id number]

## 2010-07-10 NOTE — H&P (Signed)
[place]. [facility]  Patient:    Raymond Hickman, Raymond Hickman Visit Number: [id number] MRN: [id number]          Service Type: DSU Location: [facility] 2899 3 Attending Physician:  Mayme Genta Dictated by:   Sharmon Revere, M.D. Admit Date:  01/04/2001                           History and Physical  CHIEF COMPLAINT:  Locking painful left knee.  HISTORY OF PRESENT ILLNESS:  This is a 77 year old who had been having pain, swelling and catching in the left knee with difficulty fully extending as well as flexing the left knee over the past 3 or 4 months.  The patient had been treated with anti-inflammatories with relief of some of the pain and swelling but the knee continued to give him pain on ambulating and getting up from a sitting position.  PAST MEDICAL HISTORY: 1. Circumcision. 2. High blood pressure. 3. Diabetes.  ALLERGIES:  None.  MEDICATION: 1. Mavik 2. Vitamin C.  HABITS:  None.  FAMILY HISTORY:  Noncontributory.  REVIEW OF SYSTEMS:  Basically he has been in good health other than history of present illness.  No cardiac, respiratory, no urinary bowel symptoms.  PHYSICAL EXAMINATION:  VITAL SIGNS: Temperature 98.4, pulse 56, respirations 18, blood pressure 140/80.  Height 5 feet 10 inches.  Weight 186.  HEENT:  Normocephalic and atraumatic.  Conjunctivae clear.  NECK:  Supple.  CHEST:  Clear.  CARDIAC:  S1.  EXTREMITIES:  Left knee lacking full extension and flexion.  Positive McMurrays test and a palpable and audible click laterally.  Tender anteriorly and medially as well as lateral with effusion.  Negative drawers, negative Lachmans test.  IMPRESSION:  Internal derangement left knee. Dictated by:   Sharmon Revere, M.D. Attending Physician:  Mayme Genta DD:  01/04/01 TD:  01/04/01 Job: [id number]

## 2010-07-14 ENCOUNTER — Encounter: Payer: Self-pay | Admitting: Internal Medicine

## 2010-07-14 ENCOUNTER — Ambulatory Visit (INDEPENDENT_AMBULATORY_CARE_PROVIDER_SITE_OTHER): Payer: Medicare PPO | Admitting: Internal Medicine

## 2010-07-14 DIAGNOSIS — R413 Other amnesia: Secondary | ICD-10-CM

## 2010-07-14 DIAGNOSIS — M1990 Unspecified osteoarthritis, unspecified site: Secondary | ICD-10-CM

## 2010-07-14 DIAGNOSIS — R030 Elevated blood-pressure reading, without diagnosis of hypertension: Secondary | ICD-10-CM

## 2010-07-14 DIAGNOSIS — Z23 Encounter for immunization: Secondary | ICD-10-CM

## 2010-07-14 DIAGNOSIS — K219 Gastro-esophageal reflux disease without esophagitis: Secondary | ICD-10-CM

## 2010-07-14 MED ORDER — PNEUMOCOCCAL VAC POLYVALENT 25 MCG/0.5ML IJ INJ: 0.5000 mL | INJECTION | Freq: Once | INTRAMUSCULAR | Status: DC

## 2010-07-14 NOTE — Assessment & Plan Note (Signed)
Continue to monitor off treatment - lifestyle control recommended  BP Readings from Last 3 Encounters:  07/14/10 130/70  05/11/10 128/80  03/24/10 122/72

## 2010-07-14 NOTE — Assessment & Plan Note (Signed)
symptoms improved - continue daily PPI

## 2010-07-14 NOTE — Patient Instructions (Addendum)
It was good to see you today. Medications reviewed, no changes at this time. Please schedule followup in 3-4 months, call sooner if problems. Tdap and pneumonia shots done today!

## 2010-07-14 NOTE — Assessment & Plan Note (Signed)
Stable - continue Aricept as ongoing

## 2010-07-14 NOTE — Assessment & Plan Note (Signed)
Known DDD c-spine and diffuse - improved with ultracet as needed Continue same

## 2010-07-14 NOTE — Progress Notes (Signed)
  Subjective:    Patient ID: Raymond Hickman, male    DOB: 01/22/1930, 77 y.o.   MRN: [id number]  HPI Here for follow up -   CP, chronic - improved since last OV 04/2010 long hx of vaugue symptoms - pain in chest, abd, neck, back - prior OVs reviewed similar symptoms in 05/2009 as now - xray neck and chest unremarkable Cardiac nuc stress test 02/2010 reviewed - no ischemic change Uses ultracet as needed and takes PPI - see next  GERD - on PPI - no reflux/burning - no nausea and vomiting or bowel changes  Dementia, mild - takes Aricept - reports compliance with medication(s) as prescribed. Denies adverse side effects.  Past Medical History  Diagnosis Date  . Hypertension   . DDD (degenerative disc disease), cervical   . Dementia     Mild  . Allergic rhinitis, cause unspecified      Review of Systems  Constitutional: Negative for fever.  Respiratory: Negative for cough and shortness of breath.   Cardiovascular: Negative for palpitations and leg swelling.  Neurological: Negative for headaches.       Objective:   Physical Exam BP 130/70  Pulse 62  Temp(Src) 97.6 F (36.4 C) (Oral)  Ht 5' 7.5" (1.715 m)  Wt 155 lb (70.308 kg)  BMI 23.92 kg/m2  SpO2 98%  Physical Exam  Constitutional:  oriented to person, place, and time. appears well-developed and well-nourished. No distress.  Neck: Normal range of motion. Neck supple. No JVD present. No thyromegaly present.  Cardiovascular: Normal rate, regular rhythm and normal heart sounds.  No murmur heard. Pulmonary/Chest: Effort normal and breath sounds normal. No respiratory distress. no wheezes.  Neurological: he is alert and oriented to person, place, and time. No cranial nerve deficit. Coordination normal.  Psychiatric: he has a normal mood and affect. behavior is normal. Judgment and thought content normal.   Lab Results  Component Value Date   WBC 5.1 02/27/2010   HGB 12.1* 02/27/2010   HCT 35.6* 02/27/2010   PLT 176.0  02/27/2010   CHOL 190 10/10/2008   HDL 37 10/10/2008   ALT 47 02/27/2010   AST 47* 02/27/2010   NA 142 02/27/2010   K 4.2 02/27/2010   CL 103 02/27/2010   CREATININE 1.2 02/27/2010   BUN 17 02/27/2010   CO2 32 02/27/2010   TSH 1.50 03/17/2009        Assessment & Plan:  See problem list. Medications and labs reviewed today. Time spent with pt/ 25 minutes, greater than 50% time spent counseling patient on blood pressure, GERD and medication review. Also review of prior records and immunization/HM updates

## 2010-07-21 ENCOUNTER — Emergency Department (HOSPITAL_COMMUNITY)
Admission: EM | Admit: 2010-07-21 | Discharge: 2010-07-21 | Disposition: A | Discharge status: Home / Self Care | Payer: Medicare PPO | Attending: Emergency Medicine | Admitting: Emergency Medicine

## 2010-07-21 ENCOUNTER — Emergency Department (HOSPITAL_COMMUNITY): Payer: Medicare PPO

## 2010-07-21 DIAGNOSIS — M549 Dorsalgia, unspecified: Secondary | ICD-10-CM | POA: Insufficient documentation

## 2010-07-21 DIAGNOSIS — M79609 Pain in unspecified limb: Secondary | ICD-10-CM | POA: Insufficient documentation

## 2010-07-21 DIAGNOSIS — R109 Unspecified abdominal pain: Secondary | ICD-10-CM | POA: Insufficient documentation

## 2010-07-21 DIAGNOSIS — R079 Chest pain, unspecified: Secondary | ICD-10-CM | POA: Insufficient documentation

## 2010-07-21 DIAGNOSIS — I10 Essential (primary) hypertension: Secondary | ICD-10-CM | POA: Insufficient documentation

## 2010-07-21 LAB — DIFFERENTIAL
Basophils Absolute: 0.1 10*3/uL (ref 0.0–0.1)
Basophils Relative: 1 % (ref 0–1)
Eosinophils Absolute: 0.2 10*3/uL (ref 0.0–0.7)
Lymphocytes Relative: 49 % — ABNORMAL HIGH (ref 12–46)
Monocytes Relative: 9 % (ref 3–12)
Neutrophils Relative %: 38 % — ABNORMAL LOW (ref 43–77)

## 2010-07-21 LAB — CBC
HCT: 33.7 % — ABNORMAL LOW (ref 39.0–52.0)
Platelets: 158 10*3/uL (ref 150–400)
RBC: 3.81 MIL/uL — ABNORMAL LOW (ref 4.22–5.81)
RDW: 13.6 % (ref 11.5–15.5)
WBC: 5 10*3/uL (ref 4.0–10.5)

## 2010-07-21 LAB — URINALYSIS, ROUTINE W REFLEX MICROSCOPIC
Ketones, ur: NEGATIVE mg/dL
Nitrite: NEGATIVE
Specific Gravity, Urine: 1.018 (ref 1.005–1.030)
pH: 6.5 (ref 5.0–8.0)

## 2010-07-21 LAB — COMPREHENSIVE METABOLIC PANEL
BUN: 13 mg/dL (ref 6–23)
Calcium: 9.4 mg/dL (ref 8.4–10.5)
Glucose, Bld: 97 mg/dL (ref 70–99)
Total Protein: 7.6 g/dL (ref 6.0–8.3)

## 2010-07-21 LAB — CK TOTAL AND CKMB (NOT AT ARMC)
CK, MB: 3.7 ng/mL (ref 0.3–4.0)
Relative Index: 1.3 (ref 0.0–2.5)

## 2010-07-21 LAB — LIPASE, BLOOD: Lipase: 19 U/L (ref 11–59)

## 2010-07-22 ENCOUNTER — Encounter: Payer: Self-pay | Admitting: Internal Medicine

## 2010-07-22 ENCOUNTER — Ambulatory Visit (INDEPENDENT_AMBULATORY_CARE_PROVIDER_SITE_OTHER): Payer: Medicare PPO | Admitting: Internal Medicine

## 2010-07-22 ENCOUNTER — Ambulatory Visit: Payer: Medicare PPO | Admitting: Internal Medicine

## 2010-07-22 VITALS — BP 118/72 | HR 74 | Temp 97.9°F | Ht 67.5 in | Wt 152.0 lb

## 2010-07-22 DIAGNOSIS — R413 Other amnesia: Secondary | ICD-10-CM

## 2010-07-22 DIAGNOSIS — R079 Chest pain, unspecified: Secondary | ICD-10-CM

## 2010-07-22 DIAGNOSIS — D72820 Lymphocytosis (symptomatic): Secondary | ICD-10-CM | POA: Insufficient documentation

## 2010-07-22 MED ORDER — OMEPRAZOLE 20 MG PO CPDR: 20.0000 mg | DELAYED_RELEASE_CAPSULE | Freq: Every day | ORAL | Status: DC

## 2010-07-22 MED ORDER — DONEPEZIL HCL 10 MG PO TABS: 10.0000 mg | ORAL_TABLET | Freq: Every day | ORAL | Status: DC

## 2010-07-22 NOTE — Assessment & Plan Note (Signed)
  long hx of vague symptoms - pain in chest, abd, neck, back - prior testing reviewed xray neck and chest unremarkable 05/2009; negative abd [place] 10/2008; negative head ct 08/2008 CT a/p 04/2009 negative Cardiac nuc stress test 02/2010 and 06/2010 reviewed - no ischemic change  No pain today - continue ongoing symptoms tx and surveillance as needed

## 2010-07-22 NOTE — Patient Instructions (Signed)
It was good to see you today. Medications reviewed, no changes at this time. Refill on medication(s) as discussed today. we'll make referral to hematologist (blood specialist). Our office will contact you regarding appointment(s) once made. Please keep schedule follow up as planned, call sooner if problems.

## 2010-07-22 NOTE — Progress Notes (Signed)
  Subjective:    Patient ID: Raymond Hickman, male    DOB: Jan 28, 1930, 77 y.o.   MRN: [id number]  HPI  Here for ER follow up - seen for same vague pain - chest, abdomen - eval unremarkable for cardiac problems - noted atypical lymph on CBC No night sweats, fever, weight changes -  No pains today -   CP, chronic - long hx of vague symptoms - pain in chest, abd, neck, back - prior OVs reviewed similar symptoms date back to at least 05/2009  - xray neck and chest unremarkable Cardiac nuc stress test 02/2010 reviewed - no ischemic change Uses ultracet as needed and takes PPI - see next  GERD - on PPI - no reflux/burning - no nausea and vomiting or bowel changes  Dementia, mild - takes Aricept - reports compliance with medication(s) as prescribed. Denies adverse side effects. Son at side as pt told not to drive by ER yesterday  Past Medical History  Diagnosis Date  . Hypertension   . DDD (degenerative disc disease), cervical   . Dementia     Mild  . Allergic rhinitis, cause unspecified      Review of Systems  Constitutional: Negative for fever.  Respiratory: Negative for cough and shortness of breath.   Cardiovascular: Negative for palpitations and leg swelling.  Neurological: Negative for headaches.       Objective:   Physical Exam BP 118/72  Pulse 74  Temp(Src) 97.9 F (36.6 C) (Oral)  Ht 5' 7.5" (1.715 m)  Wt 152 lb (68.947 kg)  BMI 23.46 kg/m2  SpO2 97%  Physical Exam  Constitutional:  oriented to person, place, and time. appears well-developed and well-nourished. No distress. Son at side Neck: Normal range of motion. Neck supple. No JVD present. No thyromegaly present.  Cardiovascular: Normal rate, regular rhythm and normal heart sounds.  No murmur heard. No BLE edema Pulmonary/Chest: Effort normal and breath sounds normal. No respiratory distress. no wheezes.  Neurological: he is alert and oriented to person, place, and time. No cranial nerve deficit. Coordination  normal.  Psychiatric: he has a normal mood and affect. behavior is baseline, eccentric. Judgment and thought content normal but appears distracted and tangential at times.   Lab Results  Component Value Date   WBC 5.0 07/21/2010   HGB 11.6* 07/21/2010   HCT 33.7* 07/21/2010   PLT 158 07/21/2010   CHOL 190 10/10/2008   HDL 37 10/10/2008   ALT 21 07/21/2010   AST 31 07/21/2010   NA 137 07/21/2010   K 4.1 07/21/2010   CL 101 07/21/2010   CREATININE 1.10 07/21/2010   BUN 13 07/21/2010   CO2 29 07/21/2010   TSH 1.50 03/17/2009        Assessment & Plan:  See problem list. Medications and labs reviewed today. Time spent with pt and son 25 minutes, greater than 50% time spent counseling patient on ER visit, prior workup for pain and medication review. Also need for heme input on lymphocytosis and atypical cells

## 2010-07-22 NOTE — Assessment & Plan Note (Signed)
Stable - continue Aricept as ongoing Reviewed prior eval with son today - son denies change in pt behavior, no confusion or concerns

## 2010-07-25 ENCOUNTER — Other Ambulatory Visit: Payer: Self-pay | Admitting: Internal Medicine

## 2010-07-27 NOTE — Telephone Encounter (Signed)
Rx ok to refill?

## 2010-07-28 ENCOUNTER — Encounter: Payer: Medicare PPO | Admitting: Oncology

## 2010-07-30 ENCOUNTER — Other Ambulatory Visit: Payer: Self-pay | Admitting: Oncology

## 2010-07-30 ENCOUNTER — Encounter (HOSPITAL_BASED_OUTPATIENT_CLINIC_OR_DEPARTMENT_OTHER): Payer: Medicare PPO | Admitting: Oncology

## 2010-07-30 DIAGNOSIS — D759 Disease of blood and blood-forming organs, unspecified: Secondary | ICD-10-CM

## 2010-07-30 DIAGNOSIS — F0390 Unspecified dementia without behavioral disturbance: Secondary | ICD-10-CM

## 2010-07-30 DIAGNOSIS — I10 Essential (primary) hypertension: Secondary | ICD-10-CM

## 2010-07-30 LAB — COMPREHENSIVE METABOLIC PANEL
Alkaline Phosphatase: 82 U/L (ref 39–117)
BUN: 9 mg/dL (ref 6–23)
Glucose, Bld: 114 mg/dL — ABNORMAL HIGH (ref 70–99)
Sodium: 137 mEq/L (ref 135–145)
Total Bilirubin: 0.6 mg/dL (ref 0.3–1.2)
Total Protein: 7.7 g/dL (ref 6.0–8.3)

## 2010-07-30 LAB — MORPHOLOGY

## 2010-07-30 LAB — CBC WITH DIFFERENTIAL/PLATELET
Basophils Absolute: 0 10*3/uL (ref 0.0–0.1)
EOS%: 2.1 % (ref 0.0–7.0)
HGB: 12.5 g/dL — ABNORMAL LOW (ref 13.0–17.1)
MCH: 31.1 pg (ref 27.2–33.4)
NEUT#: 2.9 10*3/uL (ref 1.5–6.5)
RDW: 13.5 % (ref 11.0–14.6)
WBC: 5.1 10*3/uL (ref 4.0–10.3)
lymph#: 1.6 10*3/uL (ref 0.9–3.3)

## 2010-08-17 ENCOUNTER — Other Ambulatory Visit: Payer: Self-pay | Admitting: Internal Medicine

## 2010-08-20 ENCOUNTER — Emergency Department (HOSPITAL_COMMUNITY)
Admission: EM | Admit: 2010-08-20 | Discharge: 2010-08-20 | Disposition: A | Discharge status: Home / Self Care | Payer: Medicare PPO | Attending: Emergency Medicine | Admitting: Emergency Medicine

## 2010-08-20 ENCOUNTER — Other Ambulatory Visit: Payer: Self-pay | Admitting: Internal Medicine

## 2010-08-20 DIAGNOSIS — Z711 Person with feared health complaint in whom no diagnosis is made: Secondary | ICD-10-CM | POA: Insufficient documentation

## 2010-08-25 ENCOUNTER — Other Ambulatory Visit: Payer: Self-pay | Admitting: Internal Medicine

## 2010-10-13 ENCOUNTER — Ambulatory Visit (INDEPENDENT_AMBULATORY_CARE_PROVIDER_SITE_OTHER): Payer: Medicare PPO | Admitting: Internal Medicine

## 2010-10-13 ENCOUNTER — Encounter: Payer: Self-pay | Admitting: Internal Medicine

## 2010-10-13 ENCOUNTER — Other Ambulatory Visit (INDEPENDENT_AMBULATORY_CARE_PROVIDER_SITE_OTHER): Payer: Medicare PPO

## 2010-10-13 DIAGNOSIS — R5383 Other fatigue: Secondary | ICD-10-CM

## 2010-10-13 DIAGNOSIS — R5381 Other malaise: Secondary | ICD-10-CM

## 2010-10-13 DIAGNOSIS — K219 Gastro-esophageal reflux disease without esophagitis: Secondary | ICD-10-CM

## 2010-10-13 DIAGNOSIS — R413 Other amnesia: Secondary | ICD-10-CM

## 2010-10-13 DIAGNOSIS — R030 Elevated blood-pressure reading, without diagnosis of hypertension: Secondary | ICD-10-CM

## 2010-10-13 LAB — CBC WITH DIFFERENTIAL/PLATELET
Basophils Absolute: 0 10*3/uL (ref 0.0–0.1)
Basophils Relative: 0.4 % (ref 0.0–3.0)
Eosinophils Absolute: 0.2 10*3/uL (ref 0.0–0.7)
Eosinophils Relative: 4.2 % (ref 0.0–5.0)
HCT: 36.5 % — ABNORMAL LOW (ref 39.0–52.0)
Hemoglobin: 12.2 g/dL — ABNORMAL LOW (ref 13.0–17.0)
Lymphocytes Relative: 34.4 % (ref 12.0–46.0)
Lymphs Abs: 1.6 10*3/uL (ref 0.7–4.0)
MCHC: 33.4 g/dL (ref 30.0–36.0)
MCV: 94.2 fl (ref 78.0–100.0)
Monocytes Absolute: 0.5 10*3/uL (ref 0.1–1.0)
Monocytes Relative: 11 % (ref 3.0–12.0)
Neutro Abs: 2.3 10*3/uL (ref 1.4–7.7)
Neutrophils Relative %: 50 % (ref 43.0–77.0)
Platelets: 173 10*3/uL (ref 150.0–400.0)
RBC: 3.87 Mil/uL — ABNORMAL LOW (ref 4.22–5.81)
RDW: 14.3 % (ref 11.5–14.6)
WBC: 4.7 10*3/uL (ref 4.5–10.5)

## 2010-10-13 LAB — VITAMIN B12: Vitamin B-12: 554 pg/mL (ref 211–911)

## 2010-10-13 LAB — TSH: TSH: 1.44 u[IU]/mL (ref 0.35–5.50)

## 2010-10-13 NOTE — Patient Instructions (Signed)
It was good to see you today. We have reviewed your prior records including labs and tests today Test(s) ordered today. Your results will be called to you after review (48-72hours after test completion). If any changes need to be made, you will be notified at that time. Medications reviewed, no changes at this time. Please schedule followup in 6 months, call sooner if problems.

## 2010-10-13 NOTE — Assessment & Plan Note (Signed)
symptoms improved - continue daily PPI

## 2010-10-13 NOTE — Progress Notes (Signed)
  Subjective:    Patient ID: Raymond Hickman, male    DOB: [birth year], 77 y.o.   MRN: [id number]  HPI  Here for follow up - reviewed chronic medical issues:  CP, chronic - improved  But not resolved long hx of vaugue symptoms - pain in chest, abd, neck, back - prior OVs reviewed similar symptoms in 05/2009 and spring/summer 2012 - xray neck and chest unremarkable Cardiac nuc stress test 02/2010 reviewed - no ischemic change Uses ultracet/muscle relaxer as needed and takes PPI - see next  GERD - on PPI - no reflux/burning - no nausea and vomiting or bowel changes  Dementia, mild - takes Aricept - reports compliance with medication(s) as prescribed. Denies adverse side effects.  Past Medical History  Diagnosis Date  . Hypertension   . DDD (degenerative disc disease), cervical   . Dementia     Mild  . Allergic rhinitis, cause unspecified      Review of Systems  Constitutional: Positive for fatigue. Negative for fever.  Respiratory: Negative for cough and shortness of breath.   Cardiovascular: Negative for palpitations and leg swelling.  Neurological: Negative for headaches.       Objective:   Physical Exam  BP 130/72  Pulse 59  Temp(Src) 98 F (36.7 C) (Oral)  Ht 5' 7.5" (1.715 m)  Wt 149 lb 8 oz (67.813 kg)  BMI 23.07 kg/m2  SpO2 99% Wt Readings from Last 3 Encounters:  10/13/10 149 lb 8 oz (67.813 kg)  07/22/10 152 lb (68.947 kg)  07/14/10 155 lb (70.308 kg)   Constitutional:  oriented to person, place, and time. appears well-developed and well-nourished. No distress.  Neck: Normal range of motion. Neck supple. No JVD present. No thyromegaly present.  Cardiovascular: Normal rate, regular rhythm and normal heart sounds.  No murmur heard. no BLE edema Pulmonary/Chest: Effort normal and breath sounds normal. No respiratory distress. no wheezes.  Neurological: he is alert and oriented to person, place, and time. No cranial nerve deficit. Coordination normal. Speech  fluent and good cognition Psychiatric: he has a normal mood and affect. behavior is normal. Judgment and thought content normal.   Lab Results  Component Value Date   WBC 5.0 07/21/2010   HGB 12.5* 07/30/2010   HCT 36.6* 07/30/2010   PLT 175 07/30/2010   CHOL 190 10/10/2008   HDL 37 10/10/2008   ALT 23 07/30/2010   AST 36 07/30/2010   NA 137 07/30/2010   K 3.7 07/30/2010   CL 99 07/30/2010   CREATININE 1.08 07/30/2010   BUN 9 07/30/2010   CO2 28 07/30/2010   TSH 1.50 03/17/2009        Assessment & Plan:  See problem list. Medications and labs reviewed today.

## 2010-10-13 NOTE — Assessment & Plan Note (Signed)
Continue to monitor off treatment - lifestyle control recommended  BP Readings from Last 3 Encounters:  10/13/10 130/72  07/22/10 118/72  07/14/10 130/70

## 2010-10-13 NOTE — Assessment & Plan Note (Signed)
Stable - continue Aricept as ongoing Reviewed prior eval  today - son denied change in pt behavior at 06/2010 OV, family reports no confusion or concerns Recheck labs today to include B12 Lab Results  Component Value Date   TSH 1.50 03/17/2009

## 2010-10-27 ENCOUNTER — Encounter (HOSPITAL_BASED_OUTPATIENT_CLINIC_OR_DEPARTMENT_OTHER): Payer: Medicare PPO | Admitting: Oncology

## 2010-10-27 ENCOUNTER — Other Ambulatory Visit: Payer: Self-pay | Admitting: Oncology

## 2010-10-27 DIAGNOSIS — D759 Disease of blood and blood-forming organs, unspecified: Secondary | ICD-10-CM

## 2010-10-27 LAB — CBC WITH DIFFERENTIAL/PLATELET
Basophils Absolute: 0 10*3/uL (ref 0.0–0.1)
EOS%: 3.6 % (ref 0.0–7.0)
Eosinophils Absolute: 0.2 10*3/uL (ref 0.0–0.5)
HCT: 31.5 % — ABNORMAL LOW (ref 38.4–49.9)
HGB: 10.8 g/dL — ABNORMAL LOW (ref 13.0–17.1)
MCH: 31.9 pg (ref 27.2–33.4)
MCV: 93.2 fL (ref 79.3–98.0)
MONO%: 11.3 % (ref 0.0–14.0)
NEUT%: 50 % (ref 39.0–75.0)

## 2010-11-03 ENCOUNTER — Other Ambulatory Visit (INDEPENDENT_AMBULATORY_CARE_PROVIDER_SITE_OTHER): Payer: Medicare PPO

## 2010-11-03 ENCOUNTER — Encounter: Payer: Self-pay | Admitting: Internal Medicine

## 2010-11-03 ENCOUNTER — Ambulatory Visit (INDEPENDENT_AMBULATORY_CARE_PROVIDER_SITE_OTHER): Payer: Medicare PPO | Admitting: Internal Medicine

## 2010-11-03 VITALS — BP 142/80 | HR 76 | Temp 97.7°F | Ht 67.5 in | Wt 149.8 lb

## 2010-11-03 DIAGNOSIS — R1013 Epigastric pain: Secondary | ICD-10-CM

## 2010-11-03 LAB — CBC WITH DIFFERENTIAL/PLATELET
Basophils Relative: 0.4 % (ref 0.0–3.0)
Eosinophils Absolute: 0.2 10*3/uL (ref 0.0–0.7)
Eosinophils Relative: 4 % (ref 0.0–5.0)
HCT: 34.7 % — ABNORMAL LOW (ref 39.0–52.0)
Lymphs Abs: 1.5 10*3/uL (ref 0.7–4.0)
MCHC: 33.6 g/dL (ref 30.0–36.0)
MCV: 94.3 fl (ref 78.0–100.0)
Monocytes Absolute: 0.5 10*3/uL (ref 0.1–1.0)
Neutrophils Relative %: 46.6 % (ref 43.0–77.0)
Platelets: 161 10*3/uL (ref 150.0–400.0)
WBC: 4.1 10*3/uL — ABNORMAL LOW (ref 4.5–10.5)

## 2010-11-03 LAB — BASIC METABOLIC PANEL
BUN: 13 mg/dL (ref 6–23)
CO2: 33 mEq/L — ABNORMAL HIGH (ref 19–32)
Chloride: 105 mEq/L (ref 96–112)
Creatinine, Ser: 1.1 mg/dL (ref 0.4–1.5)
Glucose, Bld: 87 mg/dL (ref 70–99)
Potassium: 4.2 mEq/L (ref 3.5–5.1)

## 2010-11-03 LAB — HEPATIC FUNCTION PANEL
Alkaline Phosphatase: 76 U/L (ref 39–117)
Bilirubin, Direct: 0.1 mg/dL (ref 0.0–0.3)
Total Bilirubin: 0.6 mg/dL (ref 0.3–1.2)
Total Protein: 7.2 g/dL (ref 6.0–8.3)

## 2010-11-03 NOTE — Progress Notes (Signed)
Subjective:    Patient ID: Raymond Hickman, male    DOB: 10-02-1929, 77 y.o.   MRN: [id number]  HPI  Here for abdominal pain - but currently 0/10 pain Overlap with vague chronic discomfort (see below) Located epigastric region Onset current flare 1 week ago ,intermittent during day Relieved with belching No nausea and vomiting, fever or bowel changes Describes increase "gas"  No radiation of pain  Also reviewed chronic medical issues:  CP, chronic - see above long hx of vaugue symptoms - pain in chest, abd, neck, back - prior OVs reviewed similar symptoms in 05/2009 and spring/summer 2012 - xray neck and chest unremarkable Cardiac nuc stress test 02/2010 reviewed - no ischemic change Uses ultracet/muscle relaxer as needed and takes PPI - see next  GERD - on PPI - no reflux/burning - no nausea and vomiting or bowel changes  Dementia, mild - takes Aricept - reports compliance with medication(s) as prescribed. Denies adverse side effects.  Past Medical History  Diagnosis Date  . Hypertension   . DDD (degenerative disc disease), cervical   . Dementia     Mild  . Allergic rhinitis, cause unspecified     Review of Systems  Constitutional: Positive for fatigue. Negative for fever.  Respiratory: Negative for cough and shortness of breath.   Cardiovascular: Negative for palpitations and leg swelling.  Neurological: Negative for headaches.       Objective:   Physical Exam  BP 142/80  Pulse 76  Temp(Src) 97.7 F (36.5 C) (Oral)  Ht 5' 7.5" (1.715 m)  Wt 149 lb 12.8 oz (67.949 kg)  BMI 23.12 kg/m2  SpO2 97% Wt Readings from Last 3 Encounters:  11/03/10 149 lb 12.8 oz (67.949 kg)  10/13/10 149 lb 8 oz (67.813 kg)  07/22/10 152 lb (68.947 kg)   Constitutional:  he appears well-developed and well-nourished. No distress.  Neck: Normal range of motion. Neck supple. No JVD present. No thyromegaly present.  Cardiovascular: Normal rate, regular rhythm and normal heart  sounds.  No murmur heard. no BLE edema Pulmonary/Chest: Effort normal and breath sounds normal. No respiratory distress. no wheezes.  Abd: flat, soft, nontender and ND, +BS, no mass, no R/G Neurological: he is alert and oriented to person, place, and time. No cranial nerve deficit. Coordination normal. Speech fluent and good cognition Psychiatric: he has a normal mood and affect. behavior is normal. Judgment and thought content normal.   Lab Results  Component Value Date   WBC 4.7 10/13/2010   HGB 10.8* 10/27/2010   HCT 31.5* 10/27/2010   PLT 142 10/27/2010   CHOL 190 10/10/2008   HDL 37 10/10/2008   ALT 23 07/30/2010   AST 36 07/30/2010   NA 137 07/30/2010   K 3.7 07/30/2010   CL 99 07/30/2010   CREATININE 1.08 07/30/2010   BUN 9 07/30/2010   CO2 28 07/30/2010   TSH 1.44 10/13/2010   myoview 06/2010:IMPRESSION:   1. Negative for pharmacologic-stress induced ischemia. 2. Left ventricular ejection fraction 78%.    CT a/p 04/2009:IMPRESSION:    1.  No acute findings identified within the abdomen or pelvis 2.  No mass or adenopathy identified. 3.  Small hypodensity in the right hepatic lobe is too small to characterize. Statistically this likely represents simple cyst.    [place] abd 10/2008: IMPRESSION: Unremarkable abdominal ultrasound.        Assessment & Plan:   Epigastric pain - overlap with chronic vague symptoms- reviewed prior eval as above Exam benign,  no red flags on hx Check CT a/p again now, labs today as well Continue PPI and refer to GI for consideration of EGD needs

## 2010-11-03 NOTE — Patient Instructions (Signed)
It was good to see you today. We have reviewed your prior records including labs and tests today Test(s) ordered today. Your results will be called to you after review (48-72hours after test completion). If any changes need to be made, you will be notified at that time. we'll make referral for CT scan (xray of stomach region) and for GI evaluation. Our office will contact you regarding appointment(s) once made.

## 2010-11-13 ENCOUNTER — Encounter: Payer: Self-pay | Admitting: Gastroenterology

## 2010-11-19 LAB — COMPREHENSIVE METABOLIC PANEL
AST: 33
Albumin: 3.7
BUN: 14
CO2: 31
Calcium: 9.5
Chloride: 108
Creatinine, Ser: 1.35
GFR calc Af Amer: >60
GFR calc non Af Amer: 51 — ABNORMAL LOW
Total Bilirubin: 0.8

## 2010-11-19 LAB — URINALYSIS, ROUTINE W REFLEX MICROSCOPIC
Glucose, UA: NEGATIVE
Hgb urine dipstick: NEGATIVE
Ketones, ur: NEGATIVE
Protein, ur: NEGATIVE
Urobilinogen, UA: 1

## 2010-11-19 LAB — CBC
HCT: 35.8 — ABNORMAL LOW
MCV: 91
Platelets: 171
RDW: 13.8

## 2010-11-19 LAB — LIPASE, BLOOD: Lipase: 14

## 2010-11-19 LAB — DIFFERENTIAL
Basophils Absolute: 0
Lymphocytes Relative: 35
Lymphs Abs: 1.7
Neutro Abs: 2.5

## 2010-11-24 ENCOUNTER — Ambulatory Visit (INDEPENDENT_AMBULATORY_CARE_PROVIDER_SITE_OTHER)
Admission: RE | Admit: 2010-11-24 | Discharge: 2010-11-24 | Disposition: A | Discharge status: Home / Self Care | Payer: Medicare PPO | Source: Ambulatory Visit | Attending: Internal Medicine | Admitting: Internal Medicine

## 2010-11-24 DIAGNOSIS — R1013 Epigastric pain: Secondary | ICD-10-CM

## 2010-11-24 MED ORDER — IOHEXOL 300 MG/ML  SOLN
100.0000 mL | Freq: Once | INTRAMUSCULAR | Status: AC | PRN
  Administered 2010-11-24: 100 mL via INTRAVENOUS

## 2010-11-30 ENCOUNTER — Other Ambulatory Visit: Payer: Self-pay | Admitting: Internal Medicine

## 2010-12-04 ENCOUNTER — Encounter: Payer: Self-pay | Admitting: Gastroenterology

## 2010-12-04 ENCOUNTER — Other Ambulatory Visit (INDEPENDENT_AMBULATORY_CARE_PROVIDER_SITE_OTHER): Payer: Medicare PPO

## 2010-12-04 ENCOUNTER — Ambulatory Visit (INDEPENDENT_AMBULATORY_CARE_PROVIDER_SITE_OTHER): Payer: Medicare PPO | Admitting: Gastroenterology

## 2010-12-04 DIAGNOSIS — Z1211 Encounter for screening for malignant neoplasm of colon: Secondary | ICD-10-CM

## 2010-12-04 DIAGNOSIS — R198 Other specified symptoms and signs involving the digestive system and abdomen: Secondary | ICD-10-CM

## 2010-12-04 DIAGNOSIS — K222 Esophageal obstruction: Secondary | ICD-10-CM

## 2010-12-04 DIAGNOSIS — R1013 Epigastric pain: Secondary | ICD-10-CM

## 2010-12-04 DIAGNOSIS — K219 Gastro-esophageal reflux disease without esophagitis: Secondary | ICD-10-CM | POA: Insufficient documentation

## 2010-12-04 DIAGNOSIS — F0390 Unspecified dementia without behavioral disturbance: Secondary | ICD-10-CM

## 2010-12-04 DIAGNOSIS — K5730 Diverticulosis of large intestine without perforation or abscess without bleeding: Secondary | ICD-10-CM

## 2010-12-04 DIAGNOSIS — Z79899 Other long term (current) drug therapy: Secondary | ICD-10-CM

## 2010-12-04 LAB — IBC PANEL
Iron: 61 ug/dL (ref 42–165)
Saturation Ratios: 16.7 % — ABNORMAL LOW (ref 20.0–50.0)
Transferrin: 260.3 mg/dL (ref 212.0–360.0)

## 2010-12-04 LAB — FOLATE: Folate: 21.9 ng/mL (ref >5.9)

## 2010-12-04 MED ORDER — RABEPRAZOLE SODIUM 20 MG PO TBEC: 20.0000 mg | DELAYED_RELEASE_TABLET | Freq: Every day | ORAL | Status: DC

## 2010-12-04 MED ORDER — PEG-KCL-NACL-NASULF-NA ASC-C 100 G PO SOLR: 1.0000 | ORAL | Status: DC

## 2010-12-04 NOTE — Patient Instructions (Addendum)
You have been scheduled for an Endoscopy and Colonoscopy, instructions have been provided. Your prescription for your prep and Aciphex has been sent to your pharmacy. Stop your Prilosec. Please go to our basement today for labs.

## 2010-12-04 NOTE — Progress Notes (Signed)
History of Present Illness:  This is a 77 year old African American male referred from primary care for evaluation of many years of recurrent epigastric abdominal pain, worse over the last year. His pain is described as a dull aching pain without any precipitating or alleviating elements. It usually occurs several times a day and he is somewhat better if he can burp. He also complains of" loose bowels" but denies melena or hematochezia. He's had some mild weight loss but denies any specific food intolerances. Previous endoscopy by Dr. Earlean Shawl in 2002 was remarkable for a distal esophageal stricture that was dilated. Apparently the patient has been on Prilosec 20 mg a day for many years.  Recent evaluation her primary care showed normal CBC a metabolic profile except for mild anemia and mild leukopenia. There is no evidence of hepatosplenomegaly on the scan. Liver function tests are normal. CT scan did demonstrate left colon diverticulosis. I cannot see where the patient has had endoscopy or colonoscopy in the last 10 years. He denies dysphagia, any hepatobiliary complaints, or systemic complaints. He does not smoke, abuse ethanol or NSAIDs. His past medical history is remarkable for mild dementia.  I have reviewed this patient's present history, medical and surgical past history, allergies and medications.     ROS: The remainder of the 10 point ROS is negative.. he allegedly walks several miles a day without chest pain, shortness of breath, or other cardiovascular or pulmonary complaints. He also denies genitourinary or specific neurological complaints otherwise. He is on daily Aricept. No family members were present during the exam or for review of his problems.     Physical Exam: General well developed well nourished patient in no acute distress, appearing his stated age Eyes PERRLA, no icterus, fundoscopic exam per opthamologist Skin no lesions noted Neck supple, no adenopathy, no thyroid  enlargement, no tenderness Chest clear to percussion and auscultation Heart no significant murmurs, gallops or rubs noted Abdomen no hepatosplenomegaly masses or tenderness, BS normal.  Extremities no acute joint lesions, edema, phlebitis or evidence of cellulitis. Some swelling of his left knee without any increased heat or decreased range of motion. Neurologic patient oriented x 3, he is able to converse but has obvious dementia and has impaired short and long-term memory. I cannot ascertain any focal gross neurological abnormalities.  Psychological mental status impaired,disasocciative mood,??? Comprehesion...  Assessment and plan: Atypical epigastric abdominal pain an elderly patient with progressive dementia. He certainly needs followup endoscopic exam, and I cannot see where he has had previous colonoscopy. We will try to schedule these procedures possible if possible. He will need an appointment for these procedures with a family member to undergo explanation and planning of his procedures. I have changed him from Prilosec to AcipHex 20 mg a day, and I have ordered an anemia profile. Review of his labs does show a normal B12 level. I am concerned about occult malignancy in this patient with his presentation and complaints.  No diagnosis found.

## 2010-12-05 ENCOUNTER — Other Ambulatory Visit: Payer: Self-pay | Admitting: Internal Medicine

## 2010-12-08 ENCOUNTER — Telehealth: Payer: Self-pay | Admitting: *Deleted

## 2010-12-08 MED ORDER — PANTOPRAZOLE SODIUM 40 MG PO TBEC: 40.0000 mg | DELAYED_RELEASE_TABLET | Freq: Every day | ORAL | Status: DC

## 2010-12-08 NOTE — Telephone Encounter (Signed)
I have advised patient that I have gotten a fax from his insurance company stating that they have denied Aciphex. He must try omeprazole (which he has already tried), Lansoprazole or Pantoprazole before Aciphex will be considered. Patient advised that we will send Pantoprazole to pharmacy in place of aciphex. Patient verbalizes understanding.

## 2010-12-17 ENCOUNTER — Other Ambulatory Visit: Payer: Medicare PPO

## 2010-12-17 ENCOUNTER — Encounter: Payer: Self-pay | Admitting: Endocrinology

## 2010-12-17 ENCOUNTER — Ambulatory Visit (INDEPENDENT_AMBULATORY_CARE_PROVIDER_SITE_OTHER): Payer: Medicare PPO | Admitting: Endocrinology

## 2010-12-17 DIAGNOSIS — R079 Chest pain, unspecified: Secondary | ICD-10-CM

## 2010-12-17 NOTE — Patient Instructions (Addendum)
A blood test, and a chest-x-ray, are being requested for you today.  please call [phone] to hear your test results.  You will be prompted to enter the 9-digit "MRN" number that appears at the top left of this page, followed by #.  Then you will hear the message. Continue "pantoprazole."  This may help your symptoms. You should also try simethicone ("gas-x") as needed for your symptoms.   (update: i left message on phone-tree:  rx as we discussed)

## 2010-12-17 NOTE — Progress Notes (Signed)
  Subjective:    Patient ID: Raymond Hickman, male    DOB: 10-May-1929, 77 y.o.   MRN: [id number]  HPI Pt states few mos of intermittent slight pain at the mid-anterior chest.  It is not related to exertion.  No assoc sob. Nuclear study was low-risk earlier this year.  Pain is worse with belching.   Past Medical History  Diagnosis Date  . Hypertension   . DDD (degenerative disc disease), cervical   . Dementia     Mild  . Allergic rhinitis, cause unspecified   . GERD (gastroesophageal reflux disease)     No past surgical history on file.  History   Social History  . Marital Status: Single    Spouse Name: N/A    Number of Children: 0  . Years of Education: N/A   Occupational History  . RETIRED [employer]   Social History Main Topics  . Smoking status: Never Smoker   . Smokeless tobacco: Never Used  . Alcohol Use: No  . Drug Use: No  . Sexually Active: Not on file   Other Topics Concern  . Not on file   Social History Narrative   Parents and 4 other sibling all expired due to "natural death, I think". 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 Pt is widowed and lives with youngest son    Current Outpatient Prescriptions on File Prior to Visit  Medication Sig Dispense Refill  . donepezil (ARICEPT) 10 MG tablet Take 1 tablet (10 mg total) by mouth at bedtime.  30 tablet  6  . pantoprazole (PROTONIX) 40 MG tablet Take 1 tablet (40 mg total) by mouth daily.  30 tablet  2  . tiZANidine (ZANAFLEX) 4 MG tablet take 1 tablet by mouth three times a day if needed for MUSCLE PAIN AND SPASMS.  40 tablet  1  . traMADol-acetaminophen (ULTRACET) 37.5-325 MG per tablet TAKE 1TABLET BY MOUTH EVERY 6 HOURS AS NEEDED FOR ARTHRITIS PAIN  40 tablet  1  . benzonatate (TESSALON) 100 MG capsule take 1 capsule by mouth three times a day if needed for cough  90 capsule  0  . peg 3350 powder (MOVIPREP) 100 G SOLR Take 1 kit (100 g total) by mouth as directed. See  written handout  1 kit  0    No Known Allergies  No family history on file.  BP 112/80  Pulse 80  Temp(Src) 98.6 F (37 C) (Oral)  Ht 5' 7.5" (1.715 m)  Wt 145 lb 8 oz (65.998 kg)  BMI 22.45 kg/m2  SpO2 96%  Review of Systems Denies loc.  He has a slight dry cough.      Objective:   Physical Exam VITAL SIGNS:  See vs page GENERAL: no distress Chest wall: nontender LUNGS:  Clear to auscultation HEART:  Regular rate and rhythm without murmurs noted. Normal S1,S2.     i reviewed electrocardiogram D-dimer is normal Cxr: nad    Assessment & Plan:  Chest pain, uncertain etiology Gerd.  On rx Cough, ? Due to gerd

## 2010-12-28 ENCOUNTER — Encounter: Payer: Self-pay | Admitting: Internal Medicine

## 2010-12-28 ENCOUNTER — Ambulatory Visit (INDEPENDENT_AMBULATORY_CARE_PROVIDER_SITE_OTHER): Payer: Medicare PPO | Admitting: Internal Medicine

## 2010-12-28 DIAGNOSIS — F0390 Unspecified dementia without behavioral disturbance: Secondary | ICD-10-CM

## 2010-12-28 DIAGNOSIS — R21 Rash and other nonspecific skin eruption: Secondary | ICD-10-CM

## 2010-12-28 DIAGNOSIS — R079 Chest pain, unspecified: Secondary | ICD-10-CM

## 2010-12-28 MED ORDER — AMITRIPTYLINE HCL 10 MG PO TABS: 10.0000 mg | ORAL_TABLET | Freq: Every day | ORAL | Status: DC

## 2010-12-28 MED ORDER — TRIAMCINOLONE ACETONIDE 0.1 % EX OINT: TOPICAL_OINTMENT | Freq: Two times a day (BID) | CUTANEOUS | Status: DC | PRN

## 2010-12-28 NOTE — Assessment & Plan Note (Signed)
Stable - continue Aricept as ongoing Reviewed prior eval  today -  Note son denied change in pt behavior at 06/2010 OV, family reports no confusion or concerns  Lab Results  Component Value Date   TSH 1.44 10/13/2010   Lab Results  Component Value Date   VITAMINB12 711 12/04/2010

## 2010-12-28 NOTE — Patient Instructions (Signed)
It was good to see you today. Start triamcinolone for spots/itch on chest - also start amitriptyline to help pain symptoms -  Your prescription(s) have been submitted to your pharmacy. Please take as directed and contact our office if you believe you are having problem(s) with the medication(s).

## 2010-12-28 NOTE — Assessment & Plan Note (Signed)
Ongoing - long hx of vague symptoms - pain in chest, abd, neck, back - prior testing reviewed: xray neck and chest unremarkable 05/2009; negative abd [place] 10/2008; negative head ct 08/2008 CT a/p 04/2009 negative Cardiac nuc stress test 02/2010 and 06/2010: no ischemic change EKG and CXR 12/17/10 NAD  minimal pain today - continue ongoing symptoms tx and surveillance as needed: PPI, ultracet Also start low dose amitriptyline - erx done

## 2010-12-28 NOTE — Progress Notes (Signed)
  Subjective:    Patient ID: Raymond Hickman, male    DOB: 1929/05/24, 77 y.o.   MRN: [id number]  HPI  Here for follow up - reviewed chronic medical issues:  CP, chronic - long hx of vaugue symptoms - pain in chest, abd, neck, back - prior OVs reviewed similar symptoms in 05/2009 and spring/summer 2012 - xray neck and chest unremarkable Cardiac nuc stress test 02/2010 and 06/2010 reviewed - no ischemic change Uses ultracet/muscle relaxer as needed and takes PPI - see next New itch on anterior chest - "spots"  GERD - on PPI - no reflux/burning - no nausea and vomiting or bowel changes  Dementia, mild - takes Aricept - reports compliance with medication(s) as prescribed. Denies adverse side effects.  Past Medical History  Diagnosis Date  . Hypertension   . DDD (degenerative disc disease), cervical   . Dementia     Mild  . Allergic rhinitis, cause unspecified   . GERD (gastroesophageal reflux disease)      Review of Systems  Constitutional: Positive for fatigue. Negative for fever.  Respiratory: Negative for cough and shortness of breath.   Cardiovascular: Negative for palpitations and leg swelling.  Neurological: Negative for headaches.       Objective:   Physical Exam  BP 128/82  Pulse 83  Temp(Src) 98.3 F (36.8 C) (Oral)  SpO2 99% Wt Readings from Last 3 Encounters:  12/17/10 145 lb 8 oz (65.998 kg)  12/04/10 146 lb 12.8 oz (66.588 kg)  11/03/10 149 lb 12.8 oz (67.949 kg)   Constitutional: appears well-developed and well-nourished. No distress.  Neck: Normal range of motion. Neck supple. No JVD present. No thyromegaly present.  Cardiovascular: Normal rate, regular rhythm and normal heart sounds.  No murmur heard. no BLE edema Pulmonary/Chest: Effort normal and breath sounds normal. No respiratory distress. no wheezes. Skin: 4 small nodules on anterior chest (bilaterally) with symptoms of excoriation - no cellulitis or ulceration - no lesions on abd or  back Psychiatric: he has a normal pleasant mood and "carefree" affect. Aloof and tangential but judgment and thought content fairly normal.   Lab Results  Component Value Date   WBC 4.1* 11/03/2010   HGB 11.7* 11/03/2010   HCT 34.7* 11/03/2010   PLT 161.0 11/03/2010   CHOL 190 10/10/2008   HDL 37 10/10/2008   ALT 19 11/03/2010   AST 30 11/03/2010   NA 143 11/03/2010   K 4.2 11/03/2010   CL 105 11/03/2010   CREATININE 1.1 11/03/2010   BUN 13 11/03/2010   CO2 33* 11/03/2010   TSH 1.44 10/13/2010        Assessment & Plan:  See problem list. Medications and labs reviewed today.  Skin rash - appears insect bite related with puritis and excoriation - tx with topical steroid - to call if worse/unimproved

## 2010-12-30 ENCOUNTER — Telehealth: Payer: Self-pay | Admitting: Gastroenterology

## 2010-12-30 ENCOUNTER — Other Ambulatory Visit: Payer: Self-pay | Admitting: Internal Medicine

## 2010-12-30 ENCOUNTER — Encounter: Payer: Medicare PPO | Admitting: Gastroenterology

## 2010-12-31 NOTE — Telephone Encounter (Signed)
Faxed script back to [facility]/[address]...12/31/10@8 :57am/LMB

## 2011-01-07 ENCOUNTER — Encounter: Payer: Self-pay | Admitting: Internal Medicine

## 2011-01-07 ENCOUNTER — Ambulatory Visit (INDEPENDENT_AMBULATORY_CARE_PROVIDER_SITE_OTHER): Payer: Medicare PPO | Admitting: Internal Medicine

## 2011-01-07 VITALS — BP 138/78 | HR 92 | Temp 97.8°F

## 2011-01-07 DIAGNOSIS — M7502 Adhesive capsulitis of left shoulder: Secondary | ICD-10-CM

## 2011-01-07 DIAGNOSIS — M7500 Adhesive capsulitis of unspecified shoulder: Secondary | ICD-10-CM

## 2011-01-07 MED ORDER — HYDROCODONE-ACETAMINOPHEN 5-500 MG PO TABS: 1.0000 | ORAL_TABLET | ORAL | Status: DC | PRN

## 2011-01-07 NOTE — Progress Notes (Signed)
  Subjective:    Patient ID: Raymond Hickman, male    DOB: 1929-11-27, 77 y.o.   MRN: [id number]  HPI Complains of left greater than right shoulder pain Onset > 3 months ago Denies precipitating trauma or overuse Pain symptoms worse with effort at overhead reach Pain radiates into deltoid, worse when lying on side/shoulder at night in bed   Past Medical History  Diagnosis Date  . Hypertension   . DDD (degenerative disc disease), cervical   . Dementia     Mild  . Allergic rhinitis, cause unspecified   . GERD (gastroesophageal reflux disease)     Review of Systems  Constitutional: Negative for fever and fatigue.  Neurological: Negative for weakness and numbness.       Objective:   Physical Exam BP 138/78  Pulse 92  Temp(Src) 97.8 F (36.6 C) (Oral)  SpO2 98% GEN: NAD MSkel: Left shoulder with decreased range of motion on abduction beyond 75, forward flexion and internal rotation. Positive impingement signs. Decreased strength with stressing of rotator cuff. Pain with crossed arm adduction. referred pain into distal deltoid. Tender over a.c. joint and subacromial.  Procedure Note: Shoulder intra-articular injection, glenohumeral joint Indication: Adhesive capsulitis, chronic the patient elects to proceed after verbal consent is obtained. the patient informed of possible risks and complications prior to procedure. Using sterile technique throughout, patient is injected with 1:3 depomedrol:lidocaine posterior approach into glenohumeral joint. the patient tolerated the procedure well. Ice 24-48h, heat thereafter as needed instructions aftercare provided.       Assessment & Plan:   L shoulder adhesive capsulitis - steroid injection provided for pain relief today Refer to physical therapy, limited number of hydrocodone provided to use as needed

## 2011-01-07 NOTE — Patient Instructions (Signed)
It was good to see you today. We have done an injection into her left shoulder today We'll refer to physical therapy for your shoulder pain Also supply of Vicodin to use as needed for additional pain symptoms - Your prescription(s) have been submitted to your pharmacy. Please take as directed and contact our office if you believe you are having problem(s) with the medication(s).

## 2011-01-19 ENCOUNTER — Other Ambulatory Visit: Payer: Self-pay | Admitting: Internal Medicine

## 2011-01-19 NOTE — Telephone Encounter (Signed)
F[facility]ed script [facility] to [facility] @ [phone]...01/19/11@4 :12pm/LMB

## 2011-01-27 ENCOUNTER — Ambulatory Visit: Payer: Medicare PPO | Attending: Internal Medicine | Admitting: Physical Therapy

## 2011-01-27 DIAGNOSIS — M25619 Stiffness of unspecified shoulder, not elsewhere classified: Secondary | ICD-10-CM | POA: Insufficient documentation

## 2011-01-27 DIAGNOSIS — M25519 Pain in unspecified shoulder: Secondary | ICD-10-CM | POA: Insufficient documentation

## 2011-01-27 DIAGNOSIS — R293 Abnormal posture: Secondary | ICD-10-CM | POA: Insufficient documentation

## 2011-01-27 DIAGNOSIS — IMO0001 Reserved for inherently not codable concepts without codable children: Secondary | ICD-10-CM | POA: Insufficient documentation

## 2011-01-29 ENCOUNTER — Encounter: Payer: Medicare PPO | Admitting: Rehabilitation

## 2011-01-29 ENCOUNTER — Encounter: Payer: Self-pay | Admitting: *Deleted

## 2011-01-29 ENCOUNTER — Other Ambulatory Visit: Payer: Self-pay | Admitting: Internal Medicine

## 2011-01-30 ENCOUNTER — Other Ambulatory Visit: Payer: Self-pay | Admitting: Internal Medicine

## 2011-02-01 ENCOUNTER — Encounter: Payer: Medicare PPO | Admitting: Gastroenterology

## 2011-02-01 ENCOUNTER — Encounter: Payer: Self-pay | Admitting: Gastroenterology

## 2011-02-01 NOTE — Progress Notes (Signed)
Pt no showed his ECL for the second time, he will need an office visit to make sure he understands his instructions before he has another ECL. He also needs to be charged $200 for the second no show ECL.

## 2011-02-01 NOTE — Telephone Encounter (Signed)
Charge no show fee of $200 for 12/30/2010 ECL no show. . Per Dr Sharlett Iles patient should have ov with him before he can schedule another ECL.

## 2011-02-01 NOTE — Telephone Encounter (Signed)
F[facility]ed script [facility] to [facility] @ [phone]...02/01/11@11 :51am/LMB

## 2011-02-03 ENCOUNTER — Ambulatory Visit: Payer: Medicare PPO | Admitting: Physical Therapy

## 2011-02-05 ENCOUNTER — Ambulatory Visit: Payer: Medicare PPO | Admitting: Rehabilitation

## 2011-02-07 ENCOUNTER — Other Ambulatory Visit: Payer: Self-pay | Admitting: Internal Medicine

## 2011-02-09 ENCOUNTER — Ambulatory Visit: Payer: Medicare PPO | Admitting: Gastroenterology

## 2011-02-09 ENCOUNTER — Encounter: Payer: Self-pay | Admitting: *Deleted

## 2011-02-09 ENCOUNTER — Ambulatory Visit: Payer: Medicare PPO | Admitting: Physical Therapy

## 2011-02-11 ENCOUNTER — Telehealth: Payer: Self-pay | Admitting: Gastroenterology

## 2011-02-11 ENCOUNTER — Ambulatory Visit: Payer: Medicare PPO

## 2011-02-11 NOTE — Telephone Encounter (Signed)
Dismissal Letter sent by Certified Mail [id number]  Return Receipt received showing the patient has the Dismissal Letter 02/15/2011

## 2011-02-12 ENCOUNTER — Other Ambulatory Visit: Payer: Medicare PPO | Admitting: Lab

## 2011-02-12 ENCOUNTER — Encounter: Payer: Medicare PPO | Admitting: Physical Therapy

## 2011-02-12 ENCOUNTER — Ambulatory Visit: Payer: Medicare PPO | Admitting: Oncology

## 2011-02-12 NOTE — Progress Notes (Signed)
NOT BILLED $200.00 ENDOCOLON LATE CX FEE/YF

## 2011-02-17 ENCOUNTER — Other Ambulatory Visit: Payer: Self-pay | Admitting: Internal Medicine

## 2011-02-17 NOTE — Telephone Encounter (Signed)
Request for Ultracet [last refill 11.27.12 #40x1]

## 2011-02-18 ENCOUNTER — Ambulatory Visit: Payer: Medicare PPO | Admitting: Rehabilitation

## 2011-02-18 ENCOUNTER — Other Ambulatory Visit: Payer: Self-pay | Admitting: *Deleted

## 2011-02-18 NOTE — Telephone Encounter (Signed)
Pt filled out walk-in slip need refills on tramadol & hydrocodone. He is out of medications. Pls send to [facility]...02/18/11@1 :49pm/LMB

## 2011-02-19 MED ORDER — TRAMADOL-ACETAMINOPHEN 37.5-325 MG PO TABS: 1.0000 | ORAL_TABLET | Freq: Four times a day (QID) | ORAL | Status: DC | PRN

## 2011-02-19 MED ORDER — HYDROCODONE-ACETAMINOPHEN 5-500 MG PO TABS: 1.0000 | ORAL_TABLET | Freq: Three times a day (TID) | ORAL | Status: DC | PRN

## 2011-02-19 NOTE — Telephone Encounter (Signed)
ok 

## 2011-02-19 NOTE — Telephone Encounter (Signed)
Sent tramadol electronically fax hydrocodone back to [facility]...02/19/11@8 :32am/LMB

## 2011-02-20 ENCOUNTER — Other Ambulatory Visit: Payer: Self-pay | Admitting: Internal Medicine

## 2011-02-22 ENCOUNTER — Encounter: Payer: Medicare PPO | Admitting: Rehabilitation

## 2011-02-24 ENCOUNTER — Telehealth: Payer: Self-pay | Admitting: *Deleted

## 2011-02-24 ENCOUNTER — Encounter: Payer: Medicare PPO | Admitting: Physical Therapy

## 2011-02-24 NOTE — Telephone Encounter (Signed)
Pt fill-out walk-in sheet stating needing refill on Hydrocodone & Tramadol. Both rx's has been refill on 02/19/11. Called pharmacy spoke with Merrilee Seashore to clarify. Pharmacist states did received rx 02/19/11 *& pt pick-up up vicodin on 02/19/11; Tramadol rx is not due until tomorrow 02/25/11. Called pt back to let him know what pharmacist stated...02/24/11@10 :08am/LMB

## 2011-03-03 ENCOUNTER — Encounter: Payer: Medicare PPO | Admitting: Gastroenterology

## 2011-03-11 ENCOUNTER — Other Ambulatory Visit: Payer: Self-pay | Admitting: Internal Medicine

## 2011-03-25 ENCOUNTER — Other Ambulatory Visit: Payer: Self-pay | Admitting: Internal Medicine

## 2011-03-26 NOTE — Telephone Encounter (Signed)
Faxed script back to [facility]/LMB

## 2011-04-15 ENCOUNTER — Ambulatory Visit: Payer: Medicare PPO | Admitting: Internal Medicine

## 2011-04-15 DIAGNOSIS — Z0289 Encounter for other administrative examinations: Secondary | ICD-10-CM

## 2011-04-25 ENCOUNTER — Other Ambulatory Visit: Payer: Self-pay | Admitting: Internal Medicine

## 2011-04-26 ENCOUNTER — Other Ambulatory Visit: Payer: Medicare PPO | Admitting: Lab

## 2011-04-28 ENCOUNTER — Other Ambulatory Visit: Payer: Self-pay | Admitting: *Deleted

## 2011-04-28 MED ORDER — HYDROCODONE-ACETAMINOPHEN 5-500 MG PO TABS: 1.0000 | ORAL_TABLET | Freq: Three times a day (TID) | ORAL | Status: DC | PRN

## 2011-04-28 NOTE — Telephone Encounter (Signed)
Faxed script [facility] to [facility]... 04/28/11@1 :08pm/LMB

## 2011-04-28 NOTE — Telephone Encounter (Signed)
Pt walk-in office needing refill on hydrocodone. Is this ok.... 04/28/11@10 :49am/LMB

## 2011-05-26 ENCOUNTER — Other Ambulatory Visit: Payer: Self-pay | Admitting: Internal Medicine

## 2011-06-19 ENCOUNTER — Emergency Department (HOSPITAL_COMMUNITY)
Admission: EM | Admit: 2011-06-19 | Discharge: 2011-06-19 | Disposition: A | Discharge status: Home / Self Care | Payer: Medicare PPO | Attending: Emergency Medicine | Admitting: Emergency Medicine

## 2011-06-19 ENCOUNTER — Encounter (HOSPITAL_COMMUNITY): Payer: Self-pay | Admitting: Emergency Medicine

## 2011-06-19 DIAGNOSIS — M79609 Pain in unspecified limb: Secondary | ICD-10-CM | POA: Insufficient documentation

## 2011-06-19 DIAGNOSIS — M5030 Other cervical disc degeneration, unspecified cervical region: Secondary | ICD-10-CM | POA: Insufficient documentation

## 2011-06-19 DIAGNOSIS — I10 Essential (primary) hypertension: Secondary | ICD-10-CM | POA: Insufficient documentation

## 2011-06-19 DIAGNOSIS — J309 Allergic rhinitis, unspecified: Secondary | ICD-10-CM | POA: Insufficient documentation

## 2011-06-19 DIAGNOSIS — K219 Gastro-esophageal reflux disease without esophagitis: Secondary | ICD-10-CM | POA: Insufficient documentation

## 2011-06-19 DIAGNOSIS — M79605 Pain in left leg: Secondary | ICD-10-CM

## 2011-06-19 NOTE — ED Notes (Signed)
Pt reports 3 weeks of intermittant leg pain concerned for blood clots.

## 2011-06-19 NOTE — ED Provider Notes (Signed)
History     CSN: [id number]  Arrival date & time 06/19/11  1437   First MD Initiated Contact with Patient 06/19/11 1536      Chief Complaint  Patient presents with  . Leg Pain    (Consider location/radiation/quality/duration/timing/severity/associated sxs/prior treatment) HPI... patient complains of intermittent discomfort in both legs.  These are not associated with any activity. Symptoms are brief and transient. No chest pain or shortness of breath. he ambulatory. Described as minimal  Past Medical History  Diagnosis Date  . DDD (degenerative disc disease), cervical   . Dementia     Mild  . Allergic rhinitis, cause unspecified   . GERD (gastroesophageal reflux disease)   . Hypertension     History reviewed. No pertinent past surgical history.  History reviewed. No pertinent family history.  History  Substance Use Topics  . Smoking status: Never Smoker   . Smokeless tobacco: Never Used  . Alcohol Use: No      Review of Systems  All other systems reviewed and are negative.    Allergies  Review of patient's allergies indicates no known allergies.  Home Medications   Current Outpatient Rx  Name Route Sig Dispense Refill  . DICLOFENAC SODIUM 50 MG PO TBEC Oral Take 50 mg by mouth 2 (two) times daily.    . DONEPEZIL HCL 10 MG PO TABS Oral Take 10 mg by mouth at bedtime.    Marland Kitchen PANTOPRAZOLE SODIUM 40 MG PO TBEC Oral Take 40 mg by mouth daily.    . TOBRAMYCIN-DEXAMETHASONE 0.3-0.1 % OP SUSP Both Eyes Place 1 drop into both eyes every 4 (four) hours while awake.    Marland Kitchen TRAMADOL-ACETAMINOPHEN 37.5-325 MG PO TABS Oral Take 1 tablet by mouth every 6 (six) hours as needed. Pain      BP 129/69  Pulse 67  Temp(Src) 98.3 F (36.8 C) (Oral)  Resp 20  SpO2 100%  Physical Exam  Nursing note and vitals reviewed. Constitutional: He is oriented to person, place, and time. He appears well-developed and well-nourished.  HENT:  Head: Normocephalic and atraumatic.  Eyes:  Conjunctivae and EOM are normal. Pupils are equal, round, and reactive to light.  Neck: Normal range of motion. Neck supple.  Cardiovascular: Normal rate and regular rhythm.   Pulmonary/Chest: Effort normal and breath sounds normal.  Abdominal: Soft. Bowel sounds are normal.  Musculoskeletal: Normal range of motion.       No posterior thigh or calf tenderness in bilateral legs.  Neurovascular intact throughout  Neurological: He is alert and oriented to person, place, and time.  Skin: Skin is warm and dry.  Psychiatric: He has a normal mood and affect.    ED Course  Procedures (including critical care time)  Labs Reviewed - No data to display No results found.   1. Bilateral leg pain       MDM  No clinical evidence of DVT. Patient is ambulatory without deficits. No leg tenderness.        Nat Christen, MD 06/19/11 [phone]

## 2011-06-19 NOTE — Discharge Instructions (Signed)
No evidence of a blood clot in your leg. Follow up with your primary care Dr.

## 2011-06-21 ENCOUNTER — Other Ambulatory Visit: Payer: Self-pay | Admitting: Internal Medicine

## 2011-07-14 ENCOUNTER — Telehealth: Payer: Self-pay | Admitting: Oncology

## 2011-07-14 NOTE — Telephone Encounter (Signed)
S/w pt re appt for 6/17. Also per pt mailed schedule.

## 2011-08-02 ENCOUNTER — Encounter (HOSPITAL_COMMUNITY): Payer: Self-pay

## 2011-08-02 ENCOUNTER — Emergency Department (HOSPITAL_COMMUNITY)
Admission: EM | Admit: 2011-08-02 | Discharge: 2011-08-02 | Disposition: A | Discharge status: Home / Self Care | Payer: Medicare PPO | Attending: Emergency Medicine | Admitting: Emergency Medicine

## 2011-08-02 DIAGNOSIS — M5030 Other cervical disc degeneration, unspecified cervical region: Secondary | ICD-10-CM | POA: Insufficient documentation

## 2011-08-02 DIAGNOSIS — M1990 Unspecified osteoarthritis, unspecified site: Secondary | ICD-10-CM | POA: Insufficient documentation

## 2011-08-02 DIAGNOSIS — M159 Polyosteoarthritis, unspecified: Secondary | ICD-10-CM

## 2011-08-02 DIAGNOSIS — F0390 Unspecified dementia without behavioral disturbance: Secondary | ICD-10-CM | POA: Insufficient documentation

## 2011-08-02 DIAGNOSIS — K219 Gastro-esophageal reflux disease without esophagitis: Secondary | ICD-10-CM | POA: Insufficient documentation

## 2011-08-02 DIAGNOSIS — I10 Essential (primary) hypertension: Secondary | ICD-10-CM | POA: Insufficient documentation

## 2011-08-02 NOTE — Discharge Instructions (Signed)
Degenerative Arthritis  You have osteoarthritis. This is the wear and tear arthritis that comes with aging. It is also called degenerative arthritis. This is common in people past middle age. It is caused by stress on the joints. The large weight bearing joints of the lower extremities are most often affected. The knees, hips, back, neck, and hands can become painful, swollen, and stiff. This is the most common type of arthritis. It comes on with age, carrying too much weight, or from an injury.  Treatment includes resting the sore joint until the pain and swelling improve. Crutches or a walker may be needed for severe flares. Only take over-the-counter or prescription medicines for pain, discomfort, or fever as directed by your caregiver. Local heat therapy may improve motion. Cortisone shots into the joint are sometimes used to reduce pain and swelling during flares.  Osteoarthritis is usually not crippling and progresses slowly. There are things you can do to decrease pain:  · Avoid high impact activities.  · Exercise regularly.  · Low impact exercises such as walking, biking and swimming help to keep the muscles strong and keep normal joint function.  · Stretching helps to keep your range of motion.  · Lose weight if you are overweight. This reduces joint stress.  In severe cases when you have pain at rest or increasing disability, joint surgery may be helpful. See your caregiver for follow-up treatment as recommended.   SEEK IMMEDIATE MEDICAL CARE IF:   · You have severe joint pain.  · Marked swelling and redness in your joint develops.  · You develop a high fever.  Document Released: 02/08/2005 Document Revised: 01/28/2011 Document Reviewed: 07/11/2006  [employer]® Patient Information ©2012 [employer].

## 2011-08-02 NOTE — ED Notes (Signed)
MD at bedside. 

## 2011-08-02 NOTE — ED Notes (Addendum)
Patient presents with intermittent leg pain x several days, patient states "i can feel the blood flow sometimes, and sometimes I can't"  Patient denies back pain, urinary symptoms, and weakness at this time.  Patient ambulatory in department without difficulty.

## 2011-08-02 NOTE — ED Provider Notes (Signed)
History     CSN: [id number]  Arrival date & time 08/02/11  1854   First MD Initiated Contact with Patient 08/02/11 2123      Chief Complaint  Patient presents with  . Weakness  . Leg Pain     HPI Patient presents with intermittent leg pain x several days, patient states "i can feel the blood flow sometimes, and sometimes I can't" Patient denies back pain, urinary symptoms, and weakness at this time. Patient ambulatory in department without difficulty.  Past Medical History  Diagnosis Date  . DDD (degenerative disc disease), cervical   . Dementia     Mild  . Allergic rhinitis, cause unspecified   . GERD (gastroesophageal reflux disease)   . Hypertension     History reviewed. No pertinent past surgical history.  History reviewed. No pertinent family history.  History  Substance Use Topics  . Smoking status: Never Smoker   . Smokeless tobacco: Never Used  . Alcohol Use: No      Review of Systems  All other systems reviewed and are negative.    Allergies  Review of patient's allergies indicates no known allergies.  Home Medications   Current Outpatient Rx  Name Route Sig Dispense Refill  . AMITRIPTYLINE HCL 10 MG PO TABS  take 1 tablet by mouth at bedtime 30 tablet 3  . DICLOFENAC SODIUM 50 MG PO TBEC Oral Take 50 mg by mouth 2 (two) times daily.    . DONEPEZIL HCL 10 MG PO TABS Oral Take 10 mg by mouth at bedtime.    . OMEPRAZOLE 20 MG PO CPDR  take 1 capsule by mouth once daily 30 capsule 5  . PANTOPRAZOLE SODIUM 40 MG PO TBEC Oral Take 40 mg by mouth daily.    . TRAMADOL-ACETAMINOPHEN 37.5-325 MG PO TABS Oral Take 1 tablet by mouth every 6 (six) hours as needed. Pain      BP 170/86  Pulse 65  Temp(Src) 98.4 F (36.9 C) (Oral)  Resp 18  SpO2 99%  Physical Exam  Nursing note and vitals reviewed. Constitutional: He is oriented to person, place, and time. He appears well-developed and well-nourished. No distress.  HENT:  Head: Normocephalic and  atraumatic.  Eyes: Pupils are equal, round, and reactive to light.  Neck: Normal range of motion.  Cardiovascular: Normal rate and intact distal pulses.   Pulmonary/Chest: No respiratory distress.  Abdominal: Normal appearance. He exhibits no distension.  Musculoskeletal: Normal range of motion.       Feet:  Neurological: He is alert and oriented to person, place, and time. No cranial nerve deficit.  Skin: Skin is warm and dry. No rash noted.  Psychiatric: He has a normal mood and affect. His behavior is normal.    ED Course  Procedures (including critical care time)  Labs Reviewed - No data to display No results found.   1. GENERALIZED OSTEOARTHROSIS UNSPECIFIED SITE       MDM          Dot Lanes, MD 08/02/11 2236

## 2011-08-04 ENCOUNTER — Other Ambulatory Visit: Payer: Self-pay | Admitting: Internal Medicine

## 2011-08-09 ENCOUNTER — Other Ambulatory Visit: Payer: Medicare PPO

## 2011-08-09 ENCOUNTER — Ambulatory Visit: Payer: Medicare PPO | Admitting: Oncology

## 2011-09-23 ENCOUNTER — Telehealth: Payer: Self-pay | Admitting: Internal Medicine

## 2011-10-27 ENCOUNTER — Other Ambulatory Visit: Payer: Self-pay | Admitting: Internal Medicine

## 2011-10-28 NOTE — Telephone Encounter (Signed)
F[facility]ed script [facility] to [facility]...Johny Chess

## 2011-11-16 ENCOUNTER — Other Ambulatory Visit: Payer: Self-pay | Admitting: Internal Medicine

## 2011-11-16 NOTE — Telephone Encounter (Signed)
Pt last seen on 01/07/2011 and med last filled on 10/27/11. Please advise.

## 2011-11-17 NOTE — Telephone Encounter (Signed)
ok 

## 2011-12-17 ENCOUNTER — Other Ambulatory Visit: Payer: Self-pay | Admitting: Internal Medicine

## 2011-12-17 NOTE — Telephone Encounter (Signed)
Faxed script [facility] to [facility]...lmb 

## 2011-12-17 NOTE — Telephone Encounter (Signed)
Last written 11/16/2011 #40 with 1 refill-please advise.

## 2011-12-24 ENCOUNTER — Other Ambulatory Visit: Payer: Self-pay | Admitting: Internal Medicine

## 2011-12-24 NOTE — Telephone Encounter (Signed)
Faxed script [facility] to [facility]...lmb 

## 2012-01-05 ENCOUNTER — Other Ambulatory Visit: Payer: Self-pay | Admitting: Internal Medicine

## 2012-02-05 ENCOUNTER — Encounter (HOSPITAL_COMMUNITY): Payer: Self-pay | Admitting: Family Medicine

## 2012-02-05 ENCOUNTER — Emergency Department (HOSPITAL_COMMUNITY): Payer: Medicare PPO

## 2012-02-05 ENCOUNTER — Emergency Department (HOSPITAL_COMMUNITY)
Admission: EM | Admit: 2012-02-05 | Discharge: 2012-02-05 | Disposition: A | Discharge status: Home Health | Payer: Medicare PPO | Attending: Emergency Medicine | Admitting: Emergency Medicine

## 2012-02-05 DIAGNOSIS — Z791 Long term (current) use of non-steroidal anti-inflammatories (NSAID): Secondary | ICD-10-CM | POA: Insufficient documentation

## 2012-02-05 DIAGNOSIS — I10 Essential (primary) hypertension: Secondary | ICD-10-CM | POA: Insufficient documentation

## 2012-02-05 DIAGNOSIS — Z8709 Personal history of other diseases of the respiratory system: Secondary | ICD-10-CM | POA: Insufficient documentation

## 2012-02-05 DIAGNOSIS — F0390 Unspecified dementia without behavioral disturbance: Secondary | ICD-10-CM | POA: Insufficient documentation

## 2012-02-05 DIAGNOSIS — J40 Bronchitis, not specified as acute or chronic: Secondary | ICD-10-CM | POA: Insufficient documentation

## 2012-02-05 DIAGNOSIS — Z79899 Other long term (current) drug therapy: Secondary | ICD-10-CM | POA: Insufficient documentation

## 2012-02-05 DIAGNOSIS — IMO0002 Reserved for concepts with insufficient information to code with codable children: Secondary | ICD-10-CM | POA: Insufficient documentation

## 2012-02-05 DIAGNOSIS — K219 Gastro-esophageal reflux disease without esophagitis: Secondary | ICD-10-CM | POA: Insufficient documentation

## 2012-02-05 LAB — COMPREHENSIVE METABOLIC PANEL
ALT: 28 U/L (ref 0–53)
AST: 45 U/L — ABNORMAL HIGH (ref 0–37)
Alkaline Phosphatase: 81 U/L (ref 39–117)
CO2: 28 mEq/L (ref 19–32)
Chloride: 96 mEq/L (ref 96–112)
GFR calc Af Amer: 45 mL/min — ABNORMAL LOW (ref >90)
GFR calc non Af Amer: 39 mL/min — ABNORMAL LOW (ref >90)
Glucose, Bld: 111 mg/dL — ABNORMAL HIGH (ref 70–99)
Sodium: 137 mEq/L (ref 135–145)
Total Bilirubin: 0.3 mg/dL (ref 0.3–1.2)

## 2012-02-05 LAB — CBC WITH DIFFERENTIAL/PLATELET
Basophils Absolute: 0 10*3/uL (ref 0.0–0.1)
HCT: 34.1 % — ABNORMAL LOW (ref 39.0–52.0)
Lymphocytes Relative: 33 % (ref 12–46)
Lymphs Abs: 0.8 10*3/uL (ref 0.7–4.0)
MCV: 88.6 fL (ref 78.0–100.0)
Neutro Abs: 1.3 10*3/uL — ABNORMAL LOW (ref 1.7–7.7)
Platelets: 153 10*3/uL (ref 150–400)
RBC: 3.85 MIL/uL — ABNORMAL LOW (ref 4.22–5.81)
RDW: 13.6 % (ref 11.5–15.5)
WBC: 2.4 10*3/uL — ABNORMAL LOW (ref 4.0–10.5)

## 2012-02-05 MED ORDER — AZITHROMYCIN 250 MG PO TABS
500.0000 mg | ORAL_TABLET | Freq: Once | ORAL | Status: AC
  Administered 2012-02-05: 500 mg via ORAL
  Filled 2012-02-05: qty 2

## 2012-02-05 MED ORDER — ALBUTEROL SULFATE HFA 108 (90 BASE) MCG/ACT IN AERS
2.0000 | INHALATION_SPRAY | RESPIRATORY_TRACT | Status: DC | PRN
  Administered 2012-02-05: 2 via RESPIRATORY_TRACT
  Filled 2012-02-05: qty 6.7

## 2012-02-05 MED ORDER — AZITHROMYCIN 250 MG PO TABS: 250.0000 mg | ORAL_TABLET | Freq: Every day | ORAL | Status: DC

## 2012-02-05 NOTE — ED Provider Notes (Signed)
History     CSN: [id number]  Arrival date & time 02/05/12  1349   First MD Initiated Contact with Patient 02/05/12 1604      Chief Complaint  Patient presents with  . Cough    (Consider location/radiation/quality/duration/timing/severity/associated sxs/prior treatment) HPI Level 5 caveat due to dementia: Pt with moderate dementia brought to the ED by son for evaluation of persistent cough for the last several days/weeks, associated with chest soreness that is worse with coughing. No fever, no vomiting, no diarrhea.   Past Medical History  Diagnosis Date  . DDD (degenerative disc disease), cervical   . Dementia     Mild  . Allergic rhinitis, cause unspecified   . GERD (gastroesophageal reflux disease)   . Hypertension     History reviewed. No pertinent past surgical history.  History reviewed. No pertinent family history.  History  Substance Use Topics  . Smoking status: Never Smoker   . Smokeless tobacco: Never Used  . Alcohol Use: No      Review of Systems All other systems reviewed and are negative except as noted in HPI.   Allergies  Review of patient's allergies indicates no known allergies.  Home Medications   Current Outpatient Rx  Name  Route  Sig  Dispense  Refill  . AMITRIPTYLINE HCL 10 MG PO TABS      take 1 tablet by mouth at bedtime   30 tablet   3   . DICLOFENAC SODIUM 50 MG PO TBEC   Oral   Take 50 mg by mouth 2 (two) times daily.         . DONEPEZIL HCL 10 MG PO TABS   Oral   Take 10 mg by mouth at bedtime.         Marland Kitchen HYDROCODONE-ACETAMINOPHEN 5-500 MG PO TABS      take 1 tablet by mouth every 8 hours if needed for pain   60 tablet   1   . OMEPRAZOLE 20 MG PO CPDR      take 1 capsule by mouth once daily   30 capsule   2   . PANTOPRAZOLE SODIUM 40 MG PO TBEC   Oral   Take 40 mg by mouth daily.         . TRAMADOL-ACETAMINOPHEN 37.5-325 MG PO TABS   Oral   Take 1 tablet by mouth every 6 (six) hours as needed. Pain        . TRAMADOL-ACETAMINOPHEN 37.5-325 MG PO TABS      TAKE 1TABLET BY MOUTH EVERY 6 HOURS AS NEEDED FOR ARTHRITIS PAIN   40 tablet   1     BP 118/66  Pulse 60  Temp 98.5 F (36.9 C) (Oral)  Resp 20  SpO2 96%  Physical Exam  Nursing note and vitals reviewed. Constitutional: He appears well-developed and well-nourished.  HENT:  Head: Normocephalic and atraumatic.  Eyes: EOM are normal. Pupils are equal, round, and reactive to light.  Neck: Normal range of motion. Neck supple.  Cardiovascular: Normal rate, normal heart sounds and intact distal pulses.   Pulmonary/Chest: Effort normal and breath sounds normal.  Abdominal: Bowel sounds are normal. He exhibits no distension. There is no tenderness.  Musculoskeletal: Normal range of motion. He exhibits no edema and no tenderness.  Neurological: He is alert. He has normal strength. No cranial nerve deficit or sensory deficit.  Skin: Skin is warm and dry. No rash noted.  Psychiatric: He has a normal mood and affect.  ED Course  Procedures (including critical care time)  Labs Reviewed  CBC WITH DIFFERENTIAL - Abnormal; Notable for the following:    WBC 2.4 (*)     RBC 3.85 (*)     Hemoglobin 11.9 (*)     HCT 34.1 (*)     Neutro Abs 1.3 (*)     All other components within normal limits  COMPREHENSIVE METABOLIC PANEL - Abnormal; Notable for the following:    Potassium 3.0 (*)     Glucose, Bld 111 (*)     BUN 33 (*)     Creatinine, Ser 1.60 (*)     AST 45 (*)     GFR calc non Af Amer 39 (*)     GFR calc Af Amer 45 (*)     All other components within normal limits  LIPASE, BLOOD - Abnormal; Notable for the following:    Lipase 78 (*)     All other components within normal limits  POCT I-STAT TROPONIN I   Dg Chest 2 View  02/05/2012  *RADIOLOGY REPORT*  Clinical Data: Cough  CHEST - 2 VIEW  Comparison: Chest radiograph 08/11/2009  Findings: Normal mediastinum and heart silhouette.  Lungs are hyperinflated.  No  effusion, infiltrate, or pneumothorax.  Chronic bronchitic markings are noted. Degenerative osteophytosis of the thoracic spine.  IMPRESSION: 1.  No acute cardiopulmonary process. 2.   Emphysematous change.   Original Report Authenticated By: Suzy Bouchard, M.D.      No diagnosis found.    MDM  Labs show some degree of dehydration which I believe accounts for his mildly elevated AST and lipase. He does not drink EtOH and has no abdominal complaints today. He does admit to poor PO intake recently. Son advised to encourage fluids and supplemental shakes if needed.   He has a clear xray. I suspect his symptoms are due to a bronchitis. Will give Albuterol HFA and Z-pak. Advised close PCP followup         Rakeen Gaillard B. Karle Starch, MD 02/05/12 [id number]

## 2012-02-05 NOTE — ED Notes (Signed)
Pt discharged to home with family. NAD.  

## 2012-02-05 NOTE — ED Notes (Addendum)
Per pt non-productive dry cough since Wednesday. sts also some epigastric pain associated with N,V and loss of appetite. sts sharp pains in his chest when he coughs

## 2012-02-26 ENCOUNTER — Encounter (HOSPITAL_COMMUNITY): Payer: Self-pay | Admitting: *Deleted

## 2012-02-26 ENCOUNTER — Emergency Department (HOSPITAL_COMMUNITY): Payer: Medicare PPO

## 2012-02-26 ENCOUNTER — Emergency Department (HOSPITAL_COMMUNITY)
Admission: EM | Admit: 2012-02-26 | Discharge: 2012-02-26 | Disposition: A | Discharge status: Home / Self Care | Payer: Medicare PPO | Attending: Emergency Medicine | Admitting: Emergency Medicine

## 2012-02-26 DIAGNOSIS — Z8719 Personal history of other diseases of the digestive system: Secondary | ICD-10-CM | POA: Insufficient documentation

## 2012-02-26 DIAGNOSIS — F0390 Unspecified dementia without behavioral disturbance: Secondary | ICD-10-CM | POA: Insufficient documentation

## 2012-02-26 DIAGNOSIS — Z8739 Personal history of other diseases of the musculoskeletal system and connective tissue: Secondary | ICD-10-CM | POA: Insufficient documentation

## 2012-02-26 DIAGNOSIS — R079 Chest pain, unspecified: Secondary | ICD-10-CM | POA: Insufficient documentation

## 2012-02-26 DIAGNOSIS — Z9109 Other allergy status, other than to drugs and biological substances: Secondary | ICD-10-CM | POA: Insufficient documentation

## 2012-02-26 DIAGNOSIS — Z79899 Other long term (current) drug therapy: Secondary | ICD-10-CM | POA: Insufficient documentation

## 2012-02-26 DIAGNOSIS — I10 Essential (primary) hypertension: Secondary | ICD-10-CM | POA: Insufficient documentation

## 2012-02-26 LAB — POCT I-STAT, CHEM 8
BUN: 8 mg/dL (ref 6–23)
Chloride: 103 mEq/L (ref 96–112)
Creatinine, Ser: 1 mg/dL (ref 0.50–1.35)
Hemoglobin: 11.2 g/dL — ABNORMAL LOW (ref 13.0–17.0)
Potassium: 3.5 mEq/L (ref 3.5–5.1)
Sodium: 145 mEq/L (ref 135–145)

## 2012-02-26 MED ORDER — HYDROCODONE-ACETAMINOPHEN 5-325 MG PO TABS: 1.0000 | ORAL_TABLET | ORAL | Status: DC | PRN

## 2012-02-26 MED ORDER — HYDROCODONE-ACETAMINOPHEN 5-325 MG PO TABS
1.0000 | ORAL_TABLET | Freq: Once | ORAL | Status: AC
  Administered 2012-02-26: 1 via ORAL
  Filled 2012-02-26: qty 1

## 2012-02-26 MED ORDER — IBUPROFEN 400 MG PO TABS
600.0000 mg | ORAL_TABLET | Freq: Once | ORAL | Status: AC
  Administered 2012-02-26: 600 mg via ORAL
  Filled 2012-02-26: qty 2

## 2012-02-26 NOTE — ED Provider Notes (Signed)
History     CSN: [id number]  Arrival date & time 02/26/12  0044   First MD Initiated Contact with Patient 02/26/12 0106      Chief Complaint  Patient presents with  . Abdominal Pain     The history is provided by the patient.   patient reports upper abdominal and lower chest wall pain that hurts only when he coughs.  He started coughing some tonight.  No history of DVT or pulmonary embolism.  The patient denies a history of coronary artery disease as well.  He walks nearly 2 hours every day he never develops chest pain when he walks.  His had no recent exertional chest pain or shortness of breath.  He reports that the cough began abruptly has been consistent with a day.  This is nonproductive cough.  He denies orthopnea.  No unilateral leg swelling.  He has not tried anything for the pain.  His pain is mild and only occurs with coughing.  Past Medical History  Diagnosis Date  . DDD (degenerative disc disease), cervical   . Dementia     Mild  . Allergic rhinitis, cause unspecified   . GERD (gastroesophageal reflux disease)   . Hypertension     History reviewed. No pertinent past surgical history.  History reviewed. No pertinent family history.  History  Substance Use Topics  . Smoking status: Never Smoker   . Smokeless tobacco: Never Used  . Alcohol Use: No      Review of Systems  Gastrointestinal: Positive for abdominal pain.  All other systems reviewed and are negative.    Allergies  Review of patient's allergies indicates no known allergies.  Home Medications   Current Outpatient Rx  Name  Route  Sig  Dispense  Refill  . AMLODIPINE BESYLATE 2.5 MG PO TABS   Oral   Take 2.5 mg by mouth daily.         Marland Kitchen DICLOFENAC SODIUM 50 MG PO TBEC   Oral   Take 50 mg by mouth 2 (two) times daily.         . DONEPEZIL HCL 10 MG PO TABS   Oral   Take 10 mg by mouth daily.          Marland Kitchen HYDROCODONE-ACETAMINOPHEN 5-325 MG PO TABS   Oral   Take 1 tablet by mouth  every 4 (four) hours as needed for pain.   15 tablet   0     BP 143/74  Pulse 80  Temp 97.5 F (36.4 C) (Oral)  Resp 20  SpO2 98%  Physical Exam  Nursing note and vitals reviewed. Constitutional: He is oriented to person, place, and time. He appears well-developed and well-nourished.  HENT:  Head: Normocephalic and atraumatic.  Eyes: EOM are normal.  Neck: Normal range of motion.  Cardiovascular: Normal rate, regular rhythm, normal heart sounds and intact distal pulses.   Pulmonary/Chest: Effort normal and breath sounds normal. No respiratory distress. He exhibits no tenderness.  Abdominal: Soft. He exhibits no distension. There is no tenderness. There is no rebound and no guarding.  Musculoskeletal: Normal range of motion.  Neurological: He is alert and oriented to person, place, and time.  Skin: Skin is warm and dry.  Psychiatric: He has a normal mood and affect. Judgment normal.    ED Course  Procedures (including critical care time)  Labs Reviewed  POCT I-STAT, CHEM 8 - Abnormal; Notable for the following:    Hemoglobin 11.2 (*)  HCT 33.0 (*)     All other components within normal limits  POCT I-STAT TROPONIN I   Dg Chest 2 View  02/26/2012  *RADIOLOGY REPORT*  Clinical Data: Persistent cough, epigastric abdominal pain, history GERD, hypertension  CHEST - 2 VIEW  Comparison: 02/05/2012  Findings: Normal heart size, mediastinal contours, and pulmonary vascularity. Mild chronic bronchitic changes. Eventration anterior right diaphragm. Minimal bibasilar atelectasis. No pulmonary infiltrate, pleural effusion or pneumothorax. Bones appear diffusely demineralized.  IMPRESSION: Mild chronic bronchitic changes and minimal bibasilar atelectasis.   Original Report Authenticated By: Lavonia Dana, M.D.    I personally reviewed the imaging tests through PACS system I reviewed available ER/hospitalization records through the EMR   Date: 02/26/2012  Rate: 74  Rhythm: normal sinus  rhythm  QRS Axis: normal  Intervals: normal  ST/T Wave abnormalities: normal  Conduction Disutrbances: none  Narrative Interpretation:   Old EKG Reviewed: No significant changes noted     1. Chest pain       MDM  The patient's pain is worse when he coughs.  I suspect this is musculoskeletal chest pain.  His EKG is normal.  Troponin is normal.  It only occurs when he coughs.  Discharge home in good condition with a short course of pain medicine.  His artery on call to her and        Hoy Morn, MD 02/26/12 [phone]

## 2012-02-26 NOTE — ED Notes (Signed)
Pt c/o mid-abdominal pain that increases with coughing.

## 2012-03-28 ENCOUNTER — Other Ambulatory Visit: Payer: Self-pay | Admitting: Internal Medicine

## 2012-03-28 NOTE — Telephone Encounter (Signed)
Raymond Hickman, I declined his refill for Vicodin which was given to him at the ER. If this is musculoskeletal chest pain as the ER MD said it was, he needs an antiinflammatory not Vicodin. I suggest he take 800 mg ibuprofen OTC. Raymond Hickman

## 2012-03-28 NOTE — Telephone Encounter (Signed)
MD out of office. Pls advise...Johny Chess

## 2012-05-24 ENCOUNTER — Other Ambulatory Visit: Payer: Self-pay | Admitting: Internal Medicine

## 2012-05-24 NOTE — Telephone Encounter (Signed)
Rx faxed to [facility] on [address].

## 2012-05-24 NOTE — Telephone Encounter (Signed)
Last written for 5-325mg  by ER MD on 02/26/2012 #15 with 0 refills. Please advise.

## 2012-06-18 ENCOUNTER — Other Ambulatory Visit: Payer: Self-pay | Admitting: Internal Medicine

## 2012-06-23 ENCOUNTER — Other Ambulatory Visit: Payer: Self-pay | Admitting: Internal Medicine

## 2012-10-24 ENCOUNTER — Other Ambulatory Visit: Payer: Self-pay | Admitting: Internal Medicine

## 2012-11-14 ENCOUNTER — Other Ambulatory Visit: Payer: Self-pay | Admitting: Internal Medicine

## 2012-11-24 ENCOUNTER — Other Ambulatory Visit: Payer: Self-pay | Admitting: Internal Medicine

## 2013-01-21 ENCOUNTER — Observation Stay (HOSPITAL_COMMUNITY)
Admission: EM | Admit: 2013-01-21 | Discharge: 2013-01-22 | Disposition: A | Discharge status: Home / Self Care | Payer: Medicare PPO | Attending: Internal Medicine | Admitting: Internal Medicine

## 2013-01-21 ENCOUNTER — Encounter (HOSPITAL_COMMUNITY): Payer: Self-pay | Admitting: Emergency Medicine

## 2013-01-21 DIAGNOSIS — R030 Elevated blood-pressure reading, without diagnosis of hypertension: Secondary | ICD-10-CM

## 2013-01-21 DIAGNOSIS — F0390 Unspecified dementia without behavioral disturbance: Secondary | ICD-10-CM | POA: Insufficient documentation

## 2013-01-21 DIAGNOSIS — I10 Essential (primary) hypertension: Secondary | ICD-10-CM | POA: Insufficient documentation

## 2013-01-21 DIAGNOSIS — R55 Syncope and collapse: Principal | ICD-10-CM | POA: Insufficient documentation

## 2013-01-21 DIAGNOSIS — K219 Gastro-esophageal reflux disease without esophagitis: Secondary | ICD-10-CM | POA: Insufficient documentation

## 2013-01-21 DIAGNOSIS — E876 Hypokalemia: Secondary | ICD-10-CM | POA: Insufficient documentation

## 2013-01-21 LAB — CBC
HCT: 34.4 % — ABNORMAL LOW (ref 39.0–52.0)
MCH: 30.6 pg (ref 26.0–34.0)
MCV: 89.4 fL (ref 78.0–100.0)
Platelets: 202 10*3/uL (ref 150–400)
RBC: 3.85 MIL/uL — ABNORMAL LOW (ref 4.22–5.81)
RDW: 13.8 % (ref 11.5–15.5)

## 2013-01-21 LAB — COMPREHENSIVE METABOLIC PANEL
ALT: 26 U/L (ref 0–53)
Alkaline Phosphatase: 89 U/L (ref 39–117)
BUN: 27 mg/dL — ABNORMAL HIGH (ref 6–23)
CO2: 27 mEq/L (ref 19–32)
Chloride: 100 mEq/L (ref 96–112)
GFR calc Af Amer: 57 mL/min — ABNORMAL LOW (ref >90)
GFR calc non Af Amer: 49 mL/min — ABNORMAL LOW (ref >90)
Glucose, Bld: 104 mg/dL — ABNORMAL HIGH (ref 70–99)
Potassium: 3.6 mEq/L (ref 3.5–5.1)
Total Bilirubin: 0.3 mg/dL (ref 0.3–1.2)

## 2013-01-21 MED ORDER — ACETAMINOPHEN 325 MG PO TABS
650.0000 mg | ORAL_TABLET | Freq: Four times a day (QID) | ORAL | Status: DC | PRN
  Administered 2013-01-21: 650 mg via ORAL
  Filled 2013-01-21: qty 2

## 2013-01-21 MED ORDER — DICLOFENAC SODIUM 50 MG PO TBEC
50.0000 mg | DELAYED_RELEASE_TABLET | Freq: Two times a day (BID) | ORAL | Status: DC
  Administered 2013-01-21 – 2013-01-22 (×2): 50 mg via ORAL
  Filled 2013-01-21 (×3): qty 1

## 2013-01-21 MED ORDER — ONDANSETRON HCL 4 MG/2ML IJ SOLN: 4.0000 mg | Freq: Four times a day (QID) | INTRAMUSCULAR | Status: DC | PRN

## 2013-01-21 MED ORDER — HYDROCODONE-ACETAMINOPHEN 5-325 MG PO TABS: 1.0000 | ORAL_TABLET | Freq: Four times a day (QID) | ORAL | Status: DC | PRN

## 2013-01-21 MED ORDER — ACETAMINOPHEN 650 MG RE SUPP: 650.0000 mg | Freq: Four times a day (QID) | RECTAL | Status: DC | PRN

## 2013-01-21 MED ORDER — ONDANSETRON HCL 4 MG PO TABS: 4.0000 mg | ORAL_TABLET | Freq: Four times a day (QID) | ORAL | Status: DC | PRN

## 2013-01-21 MED ORDER — SODIUM CHLORIDE 0.9 % IJ SOLN
3.0000 mL | Freq: Two times a day (BID) | INTRAMUSCULAR | Status: DC
  Administered 2013-01-22: 09:00:00 3 mL via INTRAVENOUS

## 2013-01-21 MED ORDER — DONEPEZIL HCL 10 MG PO TABS
10.0000 mg | ORAL_TABLET | Freq: Every day | ORAL | Status: DC
  Administered 2013-01-21: 21:00:00 10 mg via ORAL
  Filled 2013-01-21 (×2): qty 1

## 2013-01-21 MED ORDER — SODIUM CHLORIDE 0.9 % IV SOLN
INTRAVENOUS | Status: DC
  Administered 2013-01-21: 17:00:00 via INTRAVENOUS

## 2013-01-21 MED ORDER — PANTOPRAZOLE SODIUM 40 MG PO TBEC
40.0000 mg | DELAYED_RELEASE_TABLET | Freq: Every day | ORAL | Status: DC
  Administered 2013-01-21 – 2013-01-22 (×2): 40 mg via ORAL
  Filled 2013-01-21 (×2): qty 1

## 2013-01-21 NOTE — ED Provider Notes (Signed)
I saw and evaluated the patient, reviewed the resident's note and I agree with the findings and plan.   .Face to face Exam:  General:  Awake HEENT:  Atraumatic Resp:  Normal effort Abd:  Nondistended Neuro:No focal weakness  Dot Lanes, MD 01/21/13 1530

## 2013-01-21 NOTE — Progress Notes (Signed)
Report called to [place], RN 4 W.

## 2013-01-21 NOTE — H&P (Signed)
[facility] History and Physical  Raymond Hickman [id number] DOB: 02-24-1929 DOA: 01/21/2013  Referring physician: Laroy Apple, emergency room resident physician PCP: Birdie Riddle, MD  Specialists: None  Chief Complaint: Syncope  HPI: Raymond Hickman is a 79 y.o. male  With mild dementia, hypertension and GERD who was at church today when all of a sudden he passed out. Patient is a mild dementia is not a good historian but according to family, this has not happened before. Patient was brought in to the emergency room by EMS. History is obtained from the nephew who was at church. Reportedly there was no seizure-like activity. Reportedly, the patient was unconscious for only a few seconds. In the emergency room, lab work was unremarkable. EKG was normal. Patient is being brought in for overnight observation for syncope. Hospitalists were called for further evaluation.  Review of Systems: Patient seen after arrival to floor. He has mild dementia and speaks very softly and rapidly, but otherwise he really has no complaints. He denies headaches, dizziness, vision changes, trouble swallowing, chest pain, palpitations, shortness of breath, wheezing or coughing. He denies abdominal pain, no problems with his bowels or his bladder. He denies any weakness. He denies being tired. Denies any nausea. Review of systems otherwise negative.  Past Medical History  Diagnosis Date  . DDD (degenerative disc disease), cervical   . Dementia     Mild  . Allergic rhinitis, cause unspecified   . GERD (gastroesophageal reflux disease)   . Hypertension    History reviewed. No pertinent past surgical history. Social History:  reports that he has never smoked. He has never used smokeless tobacco. He reports that he does not drink alcohol or use illicit drugs. Patient was at home with his youngest son. He normally able to ambulate and participate in most activities of daily living without  assistance  No Known Allergies  Family history: Hypertension  Prior to Admission medications   Medication Sig Start Date End Date Taking? Authorizing Provider  amLODipine (NORVASC) 2.5 MG tablet Take 2.5 mg by mouth daily.   Yes Historical Provider, MD  diclofenac (VOLTAREN) 50 MG EC tablet Take 50 mg by mouth 2 (two) times daily.   Yes Historical Provider, MD  donepezil (ARICEPT) 10 MG tablet Take 10 mg by mouth daily.    Yes Historical Provider, MD  HYDROcodone-acetaminophen (NORCO/VICODIN) 5-325 MG per tablet Take 1 tablet by mouth every 6 (six) hours as needed for moderate pain.   Yes Historical Provider, MD  omeprazole (PRILOSEC) 20 MG capsule Take 20 mg by mouth daily.   Yes Historical Provider, MD   Physical Exam: Filed Vitals:   01/21/13 1613  BP: 160/79  Pulse: 84  Temp: 98.9 F (37.2 C)  Resp: 18     General:  Alert and oriented x2, no acute distress  Eyes: Sclera nonicteric, subtle movements are intact  ENT: Normocephalic, atraumatic, mucous from and are slightly dry  Neck: No carotid bruits  Cardiovascular: Soft, regular rate and rhythm, S1-S2  Respiratory: Clear to auscultation bilaterally  Abdomen: Soft, nontender, nondistended, positive bowel sounds  Skin: No skin breaks, tears or lesions, patient does have some evidence of pigmented areas of the scalp  Musculoskeletal: No clubbing or cyanosis or edema  Psychiatric: Mild dementia, otherwise patient is appropriate, no evidence of psychoses  Neurologic: No focal deficits, cranial nerves II through XII appear to be intact. No focal weakness  Labs on Admission:  Basic Metabolic Panel:  Recent Labs Lab 01/21/13 1326  NA 140  K 3.6  CL 100  CO2 27  GLUCOSE 104*  BUN 27*  CREATININE 1.31  CALCIUM 9.5   Liver Function Tests:  Recent Labs Lab 01/21/13 1326  AST 34  ALT 26  ALKPHOS 89  BILITOT 0.3  PROT 7.9  ALBUMIN 3.5   CBC:  Recent Labs Lab 01/21/13 1326  WBC 6.4  HGB 11.8*  HCT  34.4*  MCV 89.4  PLT 202    EKG: Normal sinus rhythm  Assessment/Plan Principal Problem:   Syncope: Suspect vasovagal. Gentle hydration. Patient does not look to be clinically dehydrated. Repeat labs in the morning. Orthostatic checks daily Active Problems:   Dementia: Stable. Continue Aricept. Note that the patient's medicine ran out, so we'll give prescription on discharge . Patient quite functional. We'll need to monitor overnight to make sure there is no sundowning, as family says that could happen    ELEVATED BLOOD PRESSURE: Continue home blood pressure meds, watching closely    GERD with stricture: Continue PPI    Code Status: Full code, discussed with sons who deferred to dad, even though he did not really seem to understand that he wanted everything done Family Communication: 1 discussed with patient and his sons at the bedside.  Disposition Plan: Possibly home tomorrow  Time spent: 25 minutes  [facility] Pager [phone]  If 7PM-7AM, please contact night-coverage [email or web address] Password [employer] 01/21/2013, 4:33 PM

## 2013-01-21 NOTE — ED Notes (Signed)
Bed: WA08 Expected date:  Expected time:  Means of arrival:  Comments: EMS Syncope

## 2013-01-21 NOTE — ED Provider Notes (Signed)
CSN: [id number]     Arrival date & time 01/21/13  1244 History   First MD Initiated Contact with Patient 01/21/13 1249     Chief Complaint  Patient presents with  . Loss of Consciousness   (Consider location/radiation/quality/duration/timing/severity/associated sxs/prior Treatment) Patient is a 79 y.o. male presenting with syncope.  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 He currently denies any pain or symptoms of any kind but cannot explain why he is here in the first place. He states that he is having the same problem he was previously. He states that it is 1932 and he is at a house.   Talking to the nephew over the phone who was at church he states that he did not see him fall but went and hlepd him up. 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 He was unconscious for monents and able to talk after he was helped to a chair. He did seem confused when he began to talk.    Past Medical History  Diagnosis Date  . DDD (degenerative disc disease), cervical   . Dementia     Mild  . Allergic rhinitis, cause unspecified   . GERD (gastroesophageal reflux disease)   . Hypertension    History reviewed. No pertinent past surgical history. History reviewed. No pertinent family history. History  Substance Use Topics  . Smoking status: Never Smoker   . Smokeless tobacco: Never Used  . Alcohol Use: No    Review of Systems  Constitutional: Negative for fever, chills and diaphoresis.  HENT: Negative for sore throat.   Eyes: Negative for visual disturbance.  Respiratory: Negative for cough and shortness of breath.   Cardiovascular: Positive for syncope.  Gastrointestinal: Negative for nausea, vomiting, abdominal pain and diarrhea.  Genitourinary: Negative for dysuria.   Musculoskeletal: Negative for back pain.  Skin: Negative for rash.  Neurological: Negative for headaches.  ROS difficult trust as patient is demented.   Allergies  Review of patient's allergies indicates no known allergies.  Home Medications   Current Outpatient Rx  Name  Route  Sig  Dispense  Refill  . amLODipine (NORVASC) 2.5 MG tablet   Oral   Take 2.5 mg by mouth daily.         . diclofenac (VOLTAREN) 50 MG EC tablet   Oral   Take 50 mg by mouth 2 (two) times daily.         Marland Kitchen donepezil (ARICEPT) 10 MG tablet   Oral   Take 10 mg by mouth daily.          Marland Kitchen HYDROcodone-acetaminophen (NORCO/VICODIN) 5-325 MG per tablet   Oral   Take 1 tablet by mouth every 6 (six) hours as needed for moderate pain.         Marland Kitchen omeprazole (PRILOSEC) 20 MG capsule   Oral   Take 20 mg by mouth daily.          BP 156/96  Pulse 108  Resp 20  SpO2 98% Physical Exam  Constitutional: He is oriented to person, place, and time. He appears well-developed and well-nourished. No distress.  HENT:  Head: Normocephalic and atraumatic.  Eyes: EOM are normal. Pupils are equal, round, and reactive to light.  Neck: Normal range of motion. Neck supple.  Cardiovascular: Normal  rate, regular rhythm and normal heart sounds.   Pulmonary/Chest: Effort normal and breath sounds normal. No respiratory distress. He has no wheezes.  Abdominal: Soft. Bowel sounds are normal. There is no tenderness.  Musculoskeletal: He exhibits no edema.  Neurological: He is alert and oriented to person, place, and time. He has normal strength. He displays no atrophy. No sensory deficit. He exhibits normal muscle tone.  Skin: Skin is warm and dry. He is not diaphoretic.  Psychiatric: He has a normal mood and affect.    ED Course  Procedures (including critical care time) Labs Review Labs Reviewed  COMPREHENSIVE METABOLIC PANEL - Abnormal; Notable for the following:    Glucose, Bld 104 (*)    BUN 27 (*)    GFR  calc non Af Amer 49 (*)    GFR calc Af Amer 57 (*)    All other components within normal limits  CBC - Abnormal; Notable for the following:    RBC 3.85 (*)    Hemoglobin 11.8 (*)    HCT 34.4 (*)    All other components within normal limits   Imaging Review No results found.  EKG Interpretation    Date/Time:  Sunday January 21 2013 14:07:29 EST Ventricular Rate:  71 PR Interval:  152 QRS Duration: 78 QT Interval:  384 QTC Calculation: 417 R Axis:   18 Text Interpretation:  Normal sinus rhythm Normal ECG No significant change since last tracing Confirmed by BEATON  MD, ROBERT ([id number]) on 01/21/2013 1:17:58 PM            MDM   1. Syncope and collapse     79  y/o male here after syncopal episode. On exam he is well appearing and pleasant but not alert and oriented. EKG is unrevealing as well as basic labs. No orthostasis by BP but he does have an increase in pulse of 20 bpm. Discussed need for admission for observation on telemetry with [facility] who agree to admit.     Timmothy Euler, MD 01/21/13 [phone]

## 2013-01-21 NOTE — ED Notes (Signed)
Per EMS pt was at church and reports feeling hot and passing out. Denies hitting head, denies pain. Per EMS grandson at scene (pt's POA) reported pt is taking blood thinner, unsure which one. Per ems pt has dementia and is A+O to person and place.

## 2013-01-22 DIAGNOSIS — K219 Gastro-esophageal reflux disease without esophagitis: Secondary | ICD-10-CM

## 2013-01-22 DIAGNOSIS — E876 Hypokalemia: Secondary | ICD-10-CM | POA: Diagnosis not present

## 2013-01-22 LAB — BASIC METABOLIC PANEL
BUN: 23 mg/dL (ref 6–23)
CO2: 26 mEq/L (ref 19–32)
Calcium: 8.7 mg/dL (ref 8.4–10.5)
Chloride: 101 mEq/L (ref 96–112)
Creatinine, Ser: 1.35 mg/dL (ref 0.50–1.35)
Glucose, Bld: 92 mg/dL (ref 70–99)

## 2013-01-22 LAB — CBC
HCT: 30.4 % — ABNORMAL LOW (ref 39.0–52.0)
MCH: 30.7 pg (ref 26.0–34.0)
MCHC: 34.2 g/dL (ref 30.0–36.0)
MCV: 89.7 fL (ref 78.0–100.0)
Platelets: 178 10*3/uL (ref 150–400)
RDW: 13.8 % (ref 11.5–15.5)

## 2013-01-22 MED ORDER — DONEPEZIL HCL 10 MG PO TABS: 10.0000 mg | ORAL_TABLET | Freq: Every day | ORAL | Status: DC

## 2013-01-22 MED ORDER — HYDROCODONE-ACETAMINOPHEN 5-325 MG PO TABS: 1.0000 | ORAL_TABLET | Freq: Four times a day (QID) | ORAL | Status: DC | PRN

## 2013-01-22 MED ORDER — AMLODIPINE BESYLATE 2.5 MG PO TABS: 2.5000 mg | ORAL_TABLET | Freq: Every day | ORAL | Status: DC

## 2013-01-22 MED ORDER — OMEPRAZOLE 20 MG PO CPDR: 20.0000 mg | DELAYED_RELEASE_CAPSULE | Freq: Every day | ORAL | Status: AC

## 2013-01-22 MED ORDER — DICLOFENAC SODIUM 50 MG PO TBEC: 50.0000 mg | DELAYED_RELEASE_TABLET | Freq: Two times a day (BID) | ORAL | Status: DC

## 2013-01-22 MED ORDER — POTASSIUM CHLORIDE CRYS ER 20 MEQ PO TBCR
40.0000 meq | EXTENDED_RELEASE_TABLET | Freq: Once | ORAL | Status: AC
  Administered 2013-01-22: 40 meq via ORAL
  Filled 2013-01-22: qty 2

## 2013-01-22 NOTE — Discharge Summary (Signed)
Physician Discharge Summary  Raymond Hickman [id number] DOB: 05-Nov-1929 DOA: 01/21/2013  PCP: Birdie Riddle, MD  Admit date: 01/21/2013 Discharge date: 01/22/2013  Time spent: 20 minutes  Recommendations for Outpatient Follow-up:  1. 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 By reports, patient was unconscious for only a few seconds with no reported seizure-like activity or other issues. He was brought in via EMS and initial ER workup was negative including normal labs and EKG  Hospital Course:  Syncope: Observed overnight with gentle hydration. No events. No arrhythmia. Lab work her main stable. Orthostatics normal. Suspect vasovagal. Patient will followup with his PCP was also a cardiologist  Dementia: Mild. Patient given a refill for his prescription for Aricept  Hypertension: Patient's prescription for Norvasc was refilled. Stable.  GERD: Patient's prescription for Prilosec was refilled. Stable.  Hypokalemia: Likely brought on by getting normal saline fluids. Replaced.  Procedures:  None  Consultations:  None  Discharge Exam: Filed Vitals:   01/22/13 0503  BP: 120/74  Pulse: 69  Temp:   Resp:     General: Alert and oriented x2, no acute distress Cardiovascular: Regular rate and rhythm, S1-S2 Respiratory: Clear to auscultation bilaterally Abdomen: Soft, nontender, nondistended, positive bowel sounds Extremities: No clubbing or cyanosis, trace  edema  Discharge Instructions  Discharge Orders   Future Orders Complete By Expires   Diet - low sodium heart healthy  As directed    Increase activity slowly  As directed        Medication List         amLODipine 2.5 MG tablet  Commonly known as:  NORVASC  Take 1 tablet (2.5 mg total) by mouth daily.     diclofenac 50 MG EC tablet  Commonly known as:  VOLTAREN  Take 1 tablet (50 mg total) by mouth 2 (two) times daily.     donepezil 10 MG tablet  Commonly known as:  ARICEPT  Take 1 tablet (10 mg total) by mouth daily.     HYDROcodone-acetaminophen 5-325 MG per tablet  Commonly known as:  NORCO/VICODIN  Take 1 tablet by mouth every 6 (six) hours as needed for moderate pain.     omeprazole 20 MG capsule  Commonly known as:  PRILOSEC  Take 1 capsule (20 mg total) by mouth daily.       No Known Allergies    The results of significant diagnostics from this hospitalization (including imaging, microbiology, ancillary and laboratory) are listed below for reference.    Significant Diagnostic Studies: No results found.  Microbiology: No results found for this or any previous visit (from the past 240 hour(s)).   Labs: Basic Metabolic Panel:  Recent Labs Lab 01/21/13 1326 01/22/13 0403  NA 140 139  K 3.6 3.0*  CL 100 101  CO2 27 26  GLUCOSE 104* 92  BUN 27* 23  CREATININE 1.31 1.35  CALCIUM 9.5 8.7   Liver Function Tests:  Recent Labs Lab 01/21/13 1326  AST  34  ALT 26  ALKPHOS 89  BILITOT 0.3  PROT 7.9  ALBUMIN 3.5   No results found for this basename: LIPASE, AMYLASE,  in the last 168 hours No results found for this basename: AMMONIA,  in the last 168 hours CBC:  Recent Labs Lab 01/21/13 1326 01/22/13 0403  WBC 6.4 5.2  HGB 11.8* 10.4*  HCT 34.4* 30.4*  MCV 89.4 89.7  PLT 202 178   Cardiac Enzymes: No results found for this basename: CKTOTAL, CKMB, CKMBINDEX, TROPONINI,  in the last 168 hours BNP: BNP (last 3 results) No results found  for this basename: PROBNP,  in the last 8760 hours CBG: No results found for this basename: GLUCAP,  in the last 168 hours     Signed:  [facility] 01/22/2013, 9:20 AM

## 2013-05-31 NOTE — Telephone Encounter (Signed)
Error

## 2014-06-29 ENCOUNTER — Emergency Department (HOSPITAL_COMMUNITY): Payer: Commercial Managed Care - HMO

## 2014-06-29 ENCOUNTER — Encounter (HOSPITAL_COMMUNITY): Payer: Self-pay | Admitting: *Deleted

## 2014-06-29 ENCOUNTER — Emergency Department (HOSPITAL_COMMUNITY)
Admission: EM | Admit: 2014-06-29 | Discharge: 2014-06-30 | Disposition: A | Discharge status: Home / Self Care | Payer: Commercial Managed Care - HMO | Attending: Emergency Medicine | Admitting: Emergency Medicine

## 2014-06-29 DIAGNOSIS — Z8739 Personal history of other diseases of the musculoskeletal system and connective tissue: Secondary | ICD-10-CM | POA: Insufficient documentation

## 2014-06-29 DIAGNOSIS — Z79899 Other long term (current) drug therapy: Secondary | ICD-10-CM | POA: Insufficient documentation

## 2014-06-29 DIAGNOSIS — J069 Acute upper respiratory infection, unspecified: Secondary | ICD-10-CM | POA: Diagnosis not present

## 2014-06-29 DIAGNOSIS — R05 Cough: Secondary | ICD-10-CM

## 2014-06-29 DIAGNOSIS — I10 Essential (primary) hypertension: Secondary | ICD-10-CM | POA: Insufficient documentation

## 2014-06-29 DIAGNOSIS — K219 Gastro-esophageal reflux disease without esophagitis: Secondary | ICD-10-CM | POA: Diagnosis not present

## 2014-06-29 DIAGNOSIS — F0390 Unspecified dementia without behavioral disturbance: Secondary | ICD-10-CM | POA: Diagnosis not present

## 2014-06-29 DIAGNOSIS — R059 Cough, unspecified: Secondary | ICD-10-CM

## 2014-06-29 LAB — CBC WITH DIFFERENTIAL/PLATELET
BASOS ABS: 0 10*3/uL (ref 0.0–0.1)
Basophils Relative: 1 % (ref 0–1)
EOS ABS: 0.3 10*3/uL (ref 0.0–0.7)
Eosinophils Relative: 6 % — ABNORMAL HIGH (ref 0–5)
HCT: 30.7 % — ABNORMAL LOW (ref 39.0–52.0)
Hemoglobin: 10.6 g/dL — ABNORMAL LOW (ref 13.0–17.0)
LYMPHS PCT: 29 % (ref 12–46)
Lymphs Abs: 1.3 10*3/uL (ref 0.7–4.0)
MCH: 31.2 pg (ref 26.0–34.0)
MCHC: 34.5 g/dL (ref 30.0–36.0)
MCV: 90.3 fL (ref 78.0–100.0)
MONOS PCT: 12 % (ref 3–12)
Monocytes Absolute: 0.5 10*3/uL (ref 0.1–1.0)
NEUTROS PCT: 52 % (ref 43–77)
Neutro Abs: 2.3 10*3/uL (ref 1.7–7.7)
PLATELETS: 190 10*3/uL (ref 150–400)
RBC: 3.4 MIL/uL — ABNORMAL LOW (ref 4.22–5.81)
RDW: 14.3 % (ref 11.5–15.5)
WBC: 4.4 10*3/uL (ref 4.0–10.5)

## 2014-06-29 LAB — COMPREHENSIVE METABOLIC PANEL
ALBUMIN: 3.5 g/dL (ref 3.5–5.0)
ALT: 26 U/L (ref 17–63)
ANION GAP: 4 — AB (ref 5–15)
AST: 53 U/L — ABNORMAL HIGH (ref 15–41)
Alkaline Phosphatase: 106 U/L (ref 38–126)
BILIRUBIN TOTAL: 0.6 mg/dL (ref 0.3–1.2)
BUN: 20 mg/dL (ref 6–20)
CALCIUM: 8.5 mg/dL — AB (ref 8.9–10.3)
CHLORIDE: 110 mmol/L (ref 101–111)
CO2: 23 mmol/L (ref 22–32)
CREATININE: 1.52 mg/dL — AB (ref 0.61–1.24)
GFR calc Af Amer: 47 mL/min — ABNORMAL LOW (ref >60)
GFR calc non Af Amer: 41 mL/min — ABNORMAL LOW (ref >60)
Glucose, Bld: 98 mg/dL (ref 70–99)
Potassium: 3.9 mmol/L (ref 3.5–5.1)
Sodium: 137 mmol/L (ref 135–145)
Total Protein: 6.6 g/dL (ref 6.5–8.1)

## 2014-06-29 NOTE — ED Notes (Signed)
Patient presents with cough.  Stated it started last night  Congested states everything is clear

## 2014-06-30 NOTE — ED Provider Notes (Signed)
CSN: [id number]     Arrival date & time 06/29/14  2135 History  This chart was scribed for Veryl Speak, MD by Randa Evens, ED Scribe. This patient was seen in room A10C/A10C and the patient's care was started at 12:39 AM.      Chief Complaint  Patient presents with  . Cough   Patient is a 81 y.o. male presenting with cough. The history is provided by the patient. No language interpreter was used.  Cough Severity:  Mild Onset quality:  Gradual Duration:  1 day Timing:  Intermittent Context: not sick contacts   Relieved by:  None tried Worsened by:  Nothing tried Ineffective treatments:  None tried Associated symptoms: rhinorrhea   Associated symptoms: no chills and no fever    HPI Comments: Raymond Hickman is a 81 y.o. male with PMHx of dementia who presents to the Emergency Department complaining of worsening cough that began at 2 days ago. Son states that he has associated rhinorrhea. Pt denies congestion, fever or chills.   Past Medical History  Diagnosis Date  . DDD (degenerative disc disease), cervical   . Dementia     Mild  . Allergic rhinitis, cause unspecified   . GERD (gastroesophageal reflux disease)   . Hypertension    History reviewed. No pertinent past surgical history. No family history on file. History  Substance Use Topics  . Smoking status: Never Smoker   . Smokeless tobacco: Never Used  . Alcohol Use: No    Review of Systems  Constitutional: Negative for fever and chills.  HENT: Positive for rhinorrhea.   Respiratory: Positive for cough.   All other systems reviewed and are negative.     Allergies  Review of patient's allergies indicates no known allergies.  Home Medications   Prior to Admission medications   Medication Sig Start Date End Date Taking? Authorizing Provider  amLODipine (NORVASC) 2.5 MG tablet Take 1 tablet (2.5 mg total) by mouth daily. Patient not taking: Reported on 06/29/2014 01/22/13   [name], MD  amLODipine  (NORVASC) 5 MG tablet Take 5 mg by mouth daily. 06/27/14   Historical Provider, MD  diclofenac (VOLTAREN) 50 MG EC tablet Take 1 tablet (50 mg total) by mouth 2 (two) times daily. Patient not taking: Reported on 06/29/2014 01/22/13   [name], MD  donepezil (ARICEPT) 10 MG tablet Take 1 tablet (10 mg total) by mouth daily. 01/22/13   [name], MD  HYDROcodone-acetaminophen (NORCO/VICODIN) 5-325 MG per tablet Take 1 tablet by mouth every 6 (six) hours as needed for moderate pain. Patient not taking: Reported on 06/29/2014 01/22/13   [name], MD  omeprazole (PRILOSEC) 20 MG capsule Take 1 capsule (20 mg total) by mouth daily. 01/22/13   [name], MD   BP 167/89 mmHg  Pulse 76  Temp(Src) 98.4 F (36.9 C) (Oral)  Resp 20  Ht 5\' 6"  (1.676 m)  Wt 135 lb (61.236 kg)  BMI 21.80 kg/m2  SpO2 98%   Physical Exam  Constitutional: He is oriented to person, place, and time. He appears well-developed and well-nourished. No distress.  HENT:  Head: Normocephalic and atraumatic.  Mouth/Throat: Oropharynx is clear and moist.  Eyes: Conjunctivae and EOM are normal.  Neck: Normal range of motion. Neck supple. No tracheal deviation present.  Cardiovascular: Normal rate, regular rhythm and normal heart sounds.   No murmur heard. Pulmonary/Chest: Effort normal and breath sounds normal. No respiratory distress. He has no wheezes. He  has no rales.  Abdominal: Soft. There is no tenderness.  Musculoskeletal: Normal range of motion.  Lymphadenopathy:    He has no cervical adenopathy.  Neurological: He is alert and oriented to person, place, and time.  Pt appears demented and somewhat confused   Skin: Skin is warm and dry.  Psychiatric: He has a normal mood and affect. His behavior is normal.  Nursing note and vitals reviewed.   ED Course  Procedures (including critical care time) DIAGNOSTIC STUDIES: Oxygen Saturation is 98% on RA, normal by my interpretation.    COORDINATION  OF CARE:  Labs Review Labs Reviewed  CBC WITH DIFFERENTIAL/PLATELET - Abnormal; Notable for the following:    RBC 3.40 (*)    Hemoglobin 10.6 (*)    HCT 30.7 (*)    Eosinophils Relative 6 (*)    All other components within normal limits  COMPREHENSIVE METABOLIC PANEL - Abnormal; Notable for the following:    Creatinine, Ser 1.52 (*)    Calcium 8.5 (*)    AST 53 (*)    GFR calc non Af Amer 41 (*)    GFR calc Af Amer 47 (*)    Anion gap 4 (*)    All other components within normal limits    Imaging Review Dg Chest 2 View  06/29/2014   CLINICAL DATA:  Productive cough and dyspnea for 1 day  EXAM: CHEST  2 VIEW  COMPARISON:  02/26/2012  FINDINGS: There is mild chronic interstitial coarsening. There is no acute infiltrate or CHF. There are no pleural effusions. Hilar, mediastinal and cardiac contours are unremarkable and unchanged.  IMPRESSION: Mild chronic interstitial coarsening. No acute cardiopulmonary findings.   Electronically Signed   By: Andreas Newport M.D.   On: 06/29/2014 22:36     EKG Interpretation None      MDM   Final diagnoses:  None      Patient is an 81 year old male with history of dementia. He presents with cough that started yesterday evening area his lungs are clear, there is no hypoxia, and vitals are very stable. He has no elevation of white count and chest x-ray reveals no pneumonia or infiltrate. Suspect a viral etiology. He will be discharged with a diagnosis of URI and advised to return as needed if symptoms worsen or change.   I personally performed the services described in this documentation, which was scribed in my presence. The recorded information has been reviewed and is accurate.      Veryl Speak, MD 06/30/14 [phone]

## 2014-06-30 NOTE — ED Notes (Signed)
Pt A&OX4, ambulatory at d/c with steady gait, NAD 

## 2014-06-30 NOTE — Discharge Instructions (Signed)
Over-the-counter cough and cold medications as needed for symptom relief.  Return to the emergency department if your symptoms significantly worsen or change.   Cough, Adult  A cough is a reflex that helps clear your throat and airways. It can help heal the body or may be a reaction to an irritated airway. A cough may only last 2 or 3 weeks (acute) or may last more than 8 weeks (chronic).  CAUSES Acute cough:  Viral or bacterial infections. Chronic cough:  Infections.  Allergies.  Asthma.  Post-nasal drip.  Smoking.  Heartburn or acid reflux.  Some medicines.  Chronic lung problems (COPD).  Cancer. SYMPTOMS   Cough.  Fever.  Chest pain.  Increased breathing rate.  High-pitched whistling sound when breathing (wheezing).  Colored mucus that you cough up (sputum). TREATMENT   A bacterial cough may be treated with antibiotic medicine.  A viral cough must run its course and will not respond to antibiotics.  Your caregiver may recommend other treatments if you have a chronic cough. HOME CARE INSTRUCTIONS   Only take over-the-counter or prescription medicines for pain, discomfort, or fever as directed by your caregiver. Use cough suppressants only as directed by your caregiver.  Use a cold steam vaporizer or humidifier in your bedroom or home to help loosen secretions.  Sleep in a semi-upright position if your cough is worse at night.  Rest as needed.  Stop smoking if you smoke. SEEK IMMEDIATE MEDICAL CARE IF:   You have pus in your sputum.  Your cough starts to worsen.  You cannot control your cough with suppressants and are losing sleep.  You begin coughing up blood.  You have difficulty breathing.  You develop pain which is getting worse or is uncontrolled with medicine.  You have a fever. MAKE SURE YOU:   Understand these instructions.  Will watch your condition.  Will get help right away if you are not doing well or get worse. Document  Released: 08/07/2010 Document Revised: 05/03/2011 Document Reviewed: 08/07/2010 [facility] Patient Information 2015 [place], [place]. This information is not intended to replace advice given to you by your health care provider. Make sure you discuss any questions you have with your health care provider.

## 2015-08-04 ENCOUNTER — Ambulatory Visit: Payer: Medicare PPO | Admitting: Internal Medicine

## 2015-08-04 DIAGNOSIS — Z0289 Encounter for other administrative examinations: Secondary | ICD-10-CM

## 2015-10-23 ENCOUNTER — Encounter: Payer: Self-pay | Admitting: Student

## 2016-01-20 ENCOUNTER — Telehealth: Payer: Self-pay | Admitting: Internal Medicine

## 2016-01-20 NOTE — Telephone Encounter (Signed)
Patient has Alzheimer and is in need of primary care again. The patients son is asking if you will accept this patient again. You have not seen him for many years.

## 2016-01-21 NOTE — Telephone Encounter (Signed)
Yes, ok with me 

## 2016-01-23 NOTE — Telephone Encounter (Signed)
Called left vm with darryl [phone]

## 2017-06-21 ENCOUNTER — Encounter (HOSPITAL_COMMUNITY): Payer: Self-pay | Admitting: Emergency Medicine

## 2017-06-21 ENCOUNTER — Emergency Department (HOSPITAL_COMMUNITY): Payer: Medicare HMO

## 2017-06-21 ENCOUNTER — Emergency Department (HOSPITAL_COMMUNITY)
Admission: EM | Admit: 2017-06-21 | Discharge: 2017-06-21 | Disposition: A | Discharge status: Home / Self Care | Payer: Medicare HMO | Attending: Emergency Medicine | Admitting: Emergency Medicine

## 2017-06-21 DIAGNOSIS — R1110 Vomiting, unspecified: Secondary | ICD-10-CM | POA: Diagnosis present

## 2017-06-21 DIAGNOSIS — Z79899 Other long term (current) drug therapy: Secondary | ICD-10-CM | POA: Diagnosis not present

## 2017-06-21 DIAGNOSIS — X30XXXA Exposure to excessive natural heat, initial encounter: Secondary | ICD-10-CM | POA: Insufficient documentation

## 2017-06-21 DIAGNOSIS — F0390 Unspecified dementia without behavioral disturbance: Secondary | ICD-10-CM | POA: Diagnosis not present

## 2017-06-21 DIAGNOSIS — I10 Essential (primary) hypertension: Secondary | ICD-10-CM | POA: Insufficient documentation

## 2017-06-21 DIAGNOSIS — T679XXA Effect of heat and light, unspecified, initial encounter: Secondary | ICD-10-CM

## 2017-06-21 LAB — CBC WITH DIFFERENTIAL/PLATELET
Basophils Absolute: 0.1 10*3/uL (ref 0.0–0.1)
Basophils Relative: 1 %
EOS ABS: 0.1 10*3/uL (ref 0.0–0.7)
EOS PCT: 1 %
HEMATOCRIT: 31.4 % — AB (ref 39.0–52.0)
Hemoglobin: 10.3 g/dL — ABNORMAL LOW (ref 13.0–17.0)
Lymphocytes Relative: 13 %
Lymphs Abs: 0.9 10*3/uL (ref 0.7–4.0)
MCH: 30.2 pg (ref 26.0–34.0)
MCHC: 32.8 g/dL (ref 30.0–36.0)
MCV: 92.1 fL (ref 78.0–100.0)
Monocytes Absolute: 0.6 10*3/uL (ref 0.1–1.0)
Monocytes Relative: 8 %
NEUTROS ABS: 5.8 10*3/uL (ref 1.7–7.7)
Neutrophils Relative %: 77 %
PLATELETS: 212 10*3/uL (ref 150–400)
RBC: 3.41 MIL/uL — ABNORMAL LOW (ref 4.22–5.81)
RDW: 15.1 % (ref 11.5–15.5)
WBC: 7.5 10*3/uL (ref 4.0–10.5)

## 2017-06-21 LAB — COMPREHENSIVE METABOLIC PANEL
ALBUMIN: 3.8 g/dL (ref 3.5–5.0)
ALT: 22 U/L (ref 17–63)
AST: 40 U/L (ref 15–41)
Alkaline Phosphatase: 77 U/L (ref 38–126)
Anion gap: 9 (ref 5–15)
BUN: 25 mg/dL — ABNORMAL HIGH (ref 6–20)
CHLORIDE: 111 mmol/L (ref 101–111)
CO2: 24 mmol/L (ref 22–32)
Calcium: 9.3 mg/dL (ref 8.9–10.3)
Creatinine, Ser: 1.26 mg/dL — ABNORMAL HIGH (ref 0.61–1.24)
GFR calc Af Amer: 58 mL/min — ABNORMAL LOW (ref >60)
GFR, EST NON AFRICAN AMERICAN: 50 mL/min — AB (ref >60)
Glucose, Bld: 103 mg/dL — ABNORMAL HIGH (ref 65–99)
POTASSIUM: 4.2 mmol/L (ref 3.5–5.1)
Sodium: 144 mmol/L (ref 135–145)
Total Bilirubin: 0.5 mg/dL (ref 0.3–1.2)
Total Protein: 8.4 g/dL — ABNORMAL HIGH (ref 6.5–8.1)

## 2017-06-21 LAB — URINALYSIS, ROUTINE W REFLEX MICROSCOPIC
BILIRUBIN URINE: NEGATIVE
Glucose, UA: NEGATIVE mg/dL
Hgb urine dipstick: NEGATIVE
Ketones, ur: NEGATIVE mg/dL
LEUKOCYTES UA: NEGATIVE
NITRITE: NEGATIVE
Protein, ur: NEGATIVE mg/dL
Specific Gravity, Urine: 1.019 (ref 1.005–1.030)
pH: 5 (ref 5.0–8.0)

## 2017-06-21 LAB — I-STAT TROPONIN, ED: TROPONIN I, POC: 0 ng/mL (ref 0.00–0.08)

## 2017-06-21 MED ORDER — SODIUM CHLORIDE 0.9 % IV BOLUS
1000.0000 mL | Freq: Once | INTRAVENOUS | Status: AC
  Administered 2017-06-21: 1000 mL via INTRAVENOUS

## 2017-06-21 NOTE — ED Notes (Signed)
Pt is gone to radiology will complete EKG when pt returns.

## 2017-06-21 NOTE — ED Provider Notes (Signed)
[place] DEPT Provider Note   CSN: [id number] Arrival date & time: 06/21/17  1618     History   Chief Complaint Chief Complaint  Patient presents with  . Heat Exposure    HPI Raymond Hickman is a 84 y.o. male.  Level 5 caveat secondary to dementia.  Per EMS patient was outside wearing 2 sweaters.  Ambient temperature right now was a hot day at 85 degrees.  Reportedly the patient was vomiting some.  The neighbor who noticed him said he also has a stomach bug and they are close friends so was worried he might of gotten sick with something.  EMS gave him 4 mg of Zofran and he was improved on arrival. The patient himself has no clue why is here and does not know where he is.  He also was not oriented to time and cannot name the president.  He denies any complaints but he cannot state to me who he lives with, he says he thinks his wife.  The history is provided by the patient and the EMS personnel.  Altered Mental Status   Chronicity: unknown. The problem has not changed since onset.Pertinent negatives include no seizures, no agitation, no delusions, no hallucinations, no self-injury and no violence. Risk factors: possible infection. His past medical history is significant for dementia.    Past Medical History:  Diagnosis Date  . Allergic rhinitis, cause unspecified   . DDD (degenerative disc disease), cervical   . Dementia    Mild  . GERD (gastroesophageal reflux disease)   . Hypertension     Patient Active Problem List   Diagnosis Date Noted  . Skin rash   . GERD with stricture 12/04/2010  . Atypical lymphocytosis 07/22/2010  . GERD 12/23/2009  . GENERALIZED OSTEOARTHROSIS UNSPECIFIED SITE 12/23/2009  . Dementia 09/15/2009  . DEGENERATIVE JOINT DISEASE 08/01/2009  . WEIGHT LOSS 08/01/2009  . NECK PAIN 06/10/2009  . CHEST PAIN UNSPECIFIED 06/10/2009  . ALLERGIC RHINITIS 03/17/2009  . HTN (hypertension) 03/17/2009    History reviewed.  No pertinent surgical history.      Home Medications    Prior to Admission medications   Medication Sig Start Date End Date Taking? Authorizing Provider  amLODipine (NORVASC) 2.5 MG tablet Take 1 tablet (2.5 mg total) by mouth daily. Patient not taking: Reported on 06/29/2014 01/22/13   [name], MD  amLODipine (NORVASC) 5 MG tablet Take 5 mg by mouth daily. 06/27/14   [provider]  diclofenac (VOLTAREN) 50 MG EC tablet Take 1 tablet (50 mg total) by mouth 2 (two) times daily. Patient not taking: Reported on 06/29/2014 01/22/13   [name], MD  donepezil (ARICEPT) 10 MG tablet Take 1 tablet (10 mg total) by mouth daily. 01/22/13   [name], MD  HYDROcodone-acetaminophen (NORCO/VICODIN) 5-325 MG per tablet Take 1 tablet by mouth every 6 (six) hours as needed for moderate pain. Patient not taking: Reported on 06/29/2014 01/22/13   [name], MD  omeprazole (PRILOSEC) 20 MG capsule Take 1 capsule (20 mg total) by mouth daily. 01/22/13   [name], MD    Family History No family history on file.  Social History Social History   Tobacco Use  . Smoking status: Never Smoker  . Smokeless tobacco: Never Used  Substance Use Topics  . Alcohol use: No  . Drug use: No     Allergies   Patient has no known allergies.   Review of Systems  Review of Systems  Unable to perform ROS: Dementia  Constitutional: Negative for chills and fever.  Respiratory: Negative for shortness of breath.   Cardiovascular: Negative for chest pain.  Gastrointestinal: Negative for abdominal pain.  Musculoskeletal: Negative for neck pain.  Skin: Negative for rash.  Neurological: Negative for seizures and headaches.  Psychiatric/Behavioral: Negative for agitation, hallucinations and self-injury.     Physical Exam Updated Vital Signs There were no vitals taken for this visit.  Physical Exam  Constitutional: He appears well-developed and well-nourished.    HENT:  Head: Normocephalic and atraumatic.  Eyes: Conjunctivae are normal.  Neck: Neck supple.  Cardiovascular: Normal rate and regular rhythm.  No murmur heard. Pulmonary/Chest: Effort normal and breath sounds normal. No respiratory distress.  Abdominal: Soft. There is no tenderness.  Musculoskeletal: He exhibits no edema.  Neurological: He is alert. He has normal strength. He is disoriented (to place and time and president). No cranial nerve deficit or sensory deficit. GCS eye subscore is 4. GCS verbal subscore is 5. GCS motor subscore is 6.  Skin: Skin is warm and dry.  Psychiatric: He has a normal mood and affect.  Nursing note and vitals reviewed.    ED Treatments / Results  Labs (all labs ordered are listed, but only abnormal results are displayed) Labs Reviewed  CBC WITH DIFFERENTIAL/PLATELET - Abnormal; Notable for the following components:      Result Value   RBC 3.41 (*)    Hemoglobin 10.3 (*)    HCT 31.4 (*)    All other components within normal limits  COMPREHENSIVE METABOLIC PANEL - Abnormal; Notable for the following components:   Glucose, Bld 103 (*)    BUN 25 (*)    Creatinine, Ser 1.26 (*)    Total Protein 8.4 (*)    GFR calc non Af Amer 50 (*)    GFR calc Af Amer 58 (*)    All other components within normal limits  URINALYSIS, ROUTINE W REFLEX MICROSCOPIC  I-STAT TROPONIN, ED    EKG EKG Interpretation  Date/Time:  Tuesday June 21 2017 17:36:45 EDT Ventricular Rate:  76 PR Interval:  168 QRS Duration: 64 QT Interval:  388 QTC Calculation: 436 R Axis:   54 Text Interpretation:  Normal sinus rhythm Normal ECG No significant change since last tracing 11/14 Confirmed by Aletta Edouard [phone]) on 06/21/2017 5:45:30 PM   Radiology Dg Chest 2 View  Result Date: 06/21/2017 CLINICAL DATA:  Vomiting. EXAM: CHEST - 2 VIEW COMPARISON:  Radiographs of Jun 29, 2014. FINDINGS: The heart size and mediastinal contours are within normal limits. Both lungs are  clear. No pneumothorax or pleural effusion is noted. The visualized skeletal structures are unremarkable. IMPRESSION: No active cardiopulmonary disease. Electronically Signed   By: Marijo Conception, M.D.   On: 06/21/2017 17:26    Procedures Procedures (including critical care time)  Medications Ordered in ED Medications  sodium chloride 0.9 % bolus 1,000 mL (has no administration in time range)     Initial Impression / Assessment and Plan / ED Course  I have reviewed the triage vital signs and the nursing notes.  Pertinent labs & imaging results that were available during my care of the patient were reviewed by me and considered in my medical decision making (see chart for details).  Clinical Course as of Jun 22 1818  Tue Jun 21, 2017  1744 Patient's son is here now and it was able to provide a little background history.  He was unaware  of why the patient was here today but does state he is got pretty substantial dementia and has a healthcare attendant who has not been doing a very good job.  He states he is Artie get somebody else lined up and ultimately knows that he eventually need to have him placed in a facility.  He understands that we are going to do some screening labs and if these are unremarkable he likely will be discharged and he is comfortable with that.   [MB]    Clinical Course User Index [MB] Hayden Rasmussen, MD    Final Clinical Impressions(s) / ED Diagnoses   Final diagnoses:  Heat exposure, initial encounter    ED Discharge Orders    None       Hayden Rasmussen, MD 06/22/17 [phone]

## 2017-06-21 NOTE — ED Triage Notes (Addendum)
Patient was found by neighbor outside wearing 2 sweaters-states patient has a history of dementia-neighbor states he started vomiting 2 hours ago-not sure if this related to the heat or a stomach virus-vomited once with EMS-4 mg of Zofran IV given in route

## 2017-06-21 NOTE — ED Notes (Signed)
Bed: [facility] Expected date:  Expected time:  Means of arrival:  Comments: EMS-vomiting/dementia

## 2017-06-21 NOTE — Discharge Instructions (Signed)
You were evaluated in the emergency department for possible heat exposure.  Your lab work did not show any signs of injury and you felt better since you been here.  You should continue to stay well-hydrated and follow-up with your regular doctor.  Return if any worsening symptoms.

## 2018-01-18 ENCOUNTER — Encounter (HOSPITAL_COMMUNITY): Payer: Self-pay | Admitting: Emergency Medicine

## 2018-01-18 ENCOUNTER — Other Ambulatory Visit: Payer: Self-pay

## 2018-01-18 ENCOUNTER — Emergency Department (HOSPITAL_COMMUNITY)
Admission: EM | Admit: 2018-01-18 | Discharge: 2018-01-18 | Disposition: A | Discharge status: Home / Self Care | Payer: Medicare HMO | Attending: Emergency Medicine | Admitting: Emergency Medicine

## 2018-01-18 DIAGNOSIS — I10 Essential (primary) hypertension: Secondary | ICD-10-CM | POA: Insufficient documentation

## 2018-01-18 DIAGNOSIS — Z79899 Other long term (current) drug therapy: Secondary | ICD-10-CM | POA: Diagnosis not present

## 2018-01-18 DIAGNOSIS — F0390 Unspecified dementia without behavioral disturbance: Secondary | ICD-10-CM | POA: Diagnosis not present

## 2018-01-18 DIAGNOSIS — E162 Hypoglycemia, unspecified: Secondary | ICD-10-CM | POA: Insufficient documentation

## 2018-01-18 DIAGNOSIS — R55 Syncope and collapse: Secondary | ICD-10-CM | POA: Diagnosis present

## 2018-01-18 LAB — COMPREHENSIVE METABOLIC PANEL
ALT: 14 U/L (ref 0–44)
AST: 23 U/L (ref 15–41)
Albumin: 3.4 g/dL — ABNORMAL LOW (ref 3.5–5.0)
Alkaline Phosphatase: 76 U/L (ref 38–126)
Anion gap: 6 (ref 5–15)
BILIRUBIN TOTAL: 0.6 mg/dL (ref 0.3–1.2)
BUN: 21 mg/dL (ref 8–23)
CALCIUM: 9 mg/dL (ref 8.9–10.3)
CO2: 24 mmol/L (ref 22–32)
CREATININE: 1.61 mg/dL — AB (ref 0.61–1.24)
Chloride: 107 mmol/L (ref 98–111)
GFR calc non Af Amer: 38 mL/min — ABNORMAL LOW (ref >60)
GFR, EST AFRICAN AMERICAN: 44 mL/min — AB (ref >60)
Glucose, Bld: 130 mg/dL — ABNORMAL HIGH (ref 70–99)
Potassium: 4 mmol/L (ref 3.5–5.1)
Sodium: 137 mmol/L (ref 135–145)
Total Protein: 7.2 g/dL (ref 6.5–8.1)

## 2018-01-18 LAB — CBC WITH DIFFERENTIAL/PLATELET
Abs Immature Granulocytes: 0.02 10*3/uL (ref 0.00–0.07)
BASOS ABS: 0 10*3/uL (ref 0.0–0.1)
BASOS PCT: 1 %
EOS ABS: 0.2 10*3/uL (ref 0.0–0.5)
Eosinophils Relative: 4 %
HCT: 30.3 % — ABNORMAL LOW (ref 39.0–52.0)
Hemoglobin: 9.4 g/dL — ABNORMAL LOW (ref 13.0–17.0)
IMMATURE GRANULOCYTES: 0 %
Lymphocytes Relative: 33 %
Lymphs Abs: 1.8 10*3/uL (ref 0.7–4.0)
MCH: 29.3 pg (ref 26.0–34.0)
MCHC: 31 g/dL (ref 30.0–36.0)
MCV: 94.4 fL (ref 80.0–100.0)
Monocytes Absolute: 0.5 10*3/uL (ref 0.1–1.0)
Monocytes Relative: 9 %
NEUTROS PCT: 53 %
NRBC: 0 % (ref 0.0–0.2)
Neutro Abs: 2.8 10*3/uL (ref 1.7–7.7)
PLATELETS: 211 10*3/uL (ref 150–400)
RBC: 3.21 MIL/uL — ABNORMAL LOW (ref 4.22–5.81)
RDW: 14.8 % (ref 11.5–15.5)
WBC: 5.3 10*3/uL (ref 4.0–10.5)

## 2018-01-18 LAB — CBG MONITORING, ED
GLUCOSE-CAPILLARY: 64 mg/dL — AB (ref 70–99)
GLUCOSE-CAPILLARY: 66 mg/dL — AB (ref 70–99)
GLUCOSE-CAPILLARY: 73 mg/dL (ref 70–99)
Glucose-Capillary: 67 mg/dL — ABNORMAL LOW (ref 70–99)
Glucose-Capillary: 79 mg/dL (ref 70–99)
Glucose-Capillary: 69 mg/dL — ABNORMAL LOW (ref 70–99)
Glucose-Capillary: 87 mg/dL (ref 70–99)

## 2018-01-18 MED ORDER — SODIUM CHLORIDE 0.9 % IV BOLUS
500.0000 mL | Freq: Once | INTRAVENOUS | Status: AC
  Administered 2018-01-18: 500 mL via INTRAVENOUS

## 2018-01-18 MED ORDER — SODIUM CHLORIDE 0.9 % IV BOLUS: 1000.0000 mL | Freq: Once | INTRAVENOUS | Status: DC

## 2018-01-18 NOTE — ED Notes (Signed)
Repeat POC CBG taken after pt ate [place] sandwich and drank a sprite. POC CBG reading 73.

## 2018-01-18 NOTE — ED Provider Notes (Signed)
Had syncope while eating breakfast - baseline is dementia alert but confused - was normal when EMS arrived.  Pt is mildly confused without any focal complaints - he has no memory of episode - he has hx of hypertension, no ACS in the past - no arrhythmia, non smoker.  No CHF and no anemia.  CBG was 63.  No edema, no rales, no hx of anemia.  No distress on exam - HR is 60's, abd is soft, Neuro is non focal - clear speech, follows commands, pleasantly cofused.  Low risk by Elease Hashimoto criteria - ED course - place on cardiac monitoring for observation in ED, labs, recheck.   EKG Interpretation  Date/Time:  Wednesday January 18 2018 09:11:39 EST Ventricular Rate:  62 PR Interval:    QRS Duration: 80 QT Interval:  424 QTC Calculation: 431 R Axis:   5 Text Interpretation:  Sinus rhythm Borderline prolonged PR interval RSR' in V1 or V2, right VCD or RVH Since last tracing rate slower Confirmed by Noemi Chapel [phone]) on 01/18/2018 9:21:06 AM       Medical screening examination/treatment/procedure(s) were conducted as a shared visit with non-physician practitioner(s) and myself.  I personally evaluated the patient during the encounter.  Clinical Impression:   Final diagnoses:  Syncope, unspecified syncope type  Hypoglycemia         Noemi Chapel, MD 01/19/18 1142

## 2018-01-18 NOTE — ED Notes (Signed)
[facility] called @ 1339-per RN-called by Levada Dy

## 2018-01-18 NOTE — ED Provider Notes (Signed)
[facility] EMERGENCY DEPARTMENT Provider Note   CSN: [id number] Arrival date & time: 01/18/18  0901   History   Chief Complaint Chief Complaint  Patient presents with  . Loss of Consciousness    HPI Raymond Hickman is a 84 y.o. male with a PMH of dementia, hypertension, and GERD presenting due to loss of consciousness during breakfast today. Pt was brought by EMS from [facility]. EMS is unsure how long the patient was unconscious. EMS reports patient was back to baseline upon arrival. Patient is alert only to person at baseline. Blood glucose was 64 upon arrival. Patient is a poor historian due to his dementia. Patient denies any acute complaints.  Level 5 caveat.   HPI  Past Medical History:  Diagnosis Date  . Allergic rhinitis, cause unspecified   . DDD (degenerative disc disease), cervical   . Dementia (HCC)    Mild  . GERD (gastroesophageal reflux disease)   . Hypertension     Patient Active Problem List   Diagnosis Date Noted  . Skin rash   . GERD with stricture 12/04/2010  . Atypical lymphocytosis 07/22/2010  . GERD 12/23/2009  . GENERALIZED OSTEOARTHROSIS UNSPECIFIED SITE 12/23/2009  . Dementia ([place]) 09/15/2009  . DEGENERATIVE JOINT DISEASE 08/01/2009  . WEIGHT LOSS 08/01/2009  . NECK PAIN 06/10/2009  . CHEST PAIN UNSPECIFIED 06/10/2009  . ALLERGIC RHINITIS 03/17/2009  . HTN (hypertension) 03/17/2009    History reviewed. No pertinent surgical history.      Home Medications    Prior to Admission medications   Medication Sig Start Date End Date Taking? Authorizing Provider  acetaminophen (TYLENOL) 325 MG tablet Take 650 mg by mouth every 6 (six) hours as needed for mild pain or moderate pain.   Yes [provider]  amLODipine (NORVASC) 2.5 MG tablet Take 1 tablet (2.5 mg total) by mouth daily. Patient taking differently: Take 5 mg by mouth daily.  01/22/13  Yes [name], MD  divalproex (DEPAKOTE SPRINKLE)  125 MG capsule Take 125 mg by mouth 2 (two) times daily.   Yes [provider]  docusate sodium (COLACE) 100 MG capsule Take 100 mg by mouth daily.   Yes [provider]  donepezil (ARICEPT) 10 MG tablet Take 1 tablet (10 mg total) by mouth daily. Patient taking differently: Take 10 mg by mouth at bedtime.  01/22/13  Yes [name], MD  ferrous sulfate 325 (65 FE) MG tablet Take 325 mg by mouth 2 (two) times daily with a meal.   Yes [provider]  LORazepam (ATIVAN) 0.5 MG tablet Take 0.5 mg by mouth 2 (two) times daily as needed (for Agitation).   Yes [provider]  omeprazole (PRILOSEC) 20 MG capsule Take 1 capsule (20 mg total) by mouth daily. Patient taking differently: Take 20 mg by mouth 2 (two) times daily before a meal.  01/22/13  Yes [name], MD  Vitamin D, Ergocalciferol, (DRISDOL) 1.25 MG (50000 UT) CAPS capsule Take 50,000 Units by mouth every 7 (seven) days.   Yes [provider]  diclofenac (VOLTAREN) 50 MG EC tablet Take 1 tablet (50 mg total) by mouth 2 (two) times daily. Patient not taking: Reported on 06/29/2014 01/22/13   [name], MD  HYDROcodone-acetaminophen (NORCO/VICODIN) 5-325 MG per tablet Take 1 tablet by mouth every 6 (six) hours as needed for moderate pain. Patient not taking: Reported on 06/21/2017 01/22/13   [name], MD    Family  History History reviewed. No pertinent family history.  Social History Social History   Tobacco Use  . Smoking status: Never Smoker  . Smokeless tobacco: Never Used  Substance Use Topics  . Alcohol use: No  . Drug use: No     Allergies   Patient has no known allergies.   Review of Systems Review of Systems  Unable to perform ROS: Dementia     Physical Exam Updated Vital Signs BP 137/83 (BP Location: Left Arm)   Pulse 78   Temp 98.7 F (37.1 C) (Oral)   Resp 20   SpO2 100%   Physical Exam  Constitutional: He appears well-developed  and well-nourished. No distress.  HENT:  Head: Normocephalic and atraumatic. Head is without raccoon's eyes and without Battle's sign.  Right Ear: External ear and ear canal normal.  Left Ear: External ear and ear canal normal.  Nose: Nose normal.  Mouth/Throat: Uvula is midline, oropharynx is clear and moist and mucous membranes are normal.  Neck: Normal range of motion. Neck supple.  Cardiovascular: Normal rate, regular rhythm and normal heart sounds. Exam reveals no gallop and no friction rub.  No murmur heard. Pulmonary/Chest: Effort normal and breath sounds normal. No respiratory distress. He has no wheezes. He has no rales.  Abdominal: Soft. He exhibits no distension. There is no tenderness. There is no guarding.  Musculoskeletal: Normal range of motion.  Neurological: He is alert.  Skin: No rash noted. He is not diaphoretic. No erythema.  Psychiatric: He has a normal mood and affect.  Nursing note and vitals reviewed.  Mental Status:  Alert and oriented to name only. Patient is not able to provide date of birth or current location. Pt is not able to give a coherent history. Speech fluent without evidence of aphasia. Able to follow 2 step commands without difficulty.  Cranial Nerves:  II:  Peripheral visual fields grossly normal, pupils equal, round, reactive to light III,IV, VI: ptosis not present, extra-ocular motions intact bilaterally  V,VII: smile symmetric, facial light touch sensation equal VIII: hearing grossly normal to voice  X: uvula elevates symmetrically  XI: bilateral shoulder shrug symmetric and strong XII: midline tongue extension without fassiculations Motor:  Normal tone. 5/5 in upper and lower extremities bilaterally including strong and equal grip strength and dorsiflexion/plantar flexion Sensory: light touch normal in all extremities.  Cerebellar: normal finger-to-nose with bilateral upper extremities Negative pronator drift. CV: distal pulses palpable  throughout    ED Treatments / Results  Labs (all labs ordered are listed, but only abnormal results are displayed) Labs Reviewed  COMPREHENSIVE METABOLIC PANEL - Abnormal; Notable for the following components:      Result Value   Glucose, Bld 130 (*)    Creatinine, Ser 1.61 (*)    Albumin 3.4 (*)    GFR calc non Af Amer 38 (*)    GFR calc Af Amer 44 (*)    All other components within normal limits  CBC WITH DIFFERENTIAL/PLATELET - Abnormal; Notable for the following components:   RBC 3.21 (*)    Hemoglobin 9.4 (*)    HCT 30.3 (*)    All other components within normal limits  CBG MONITORING, ED - Abnormal; Notable for the following components:   Glucose-Capillary 67 (*)    All other components within normal limits  CBG MONITORING, ED - Abnormal; Notable for the following components:   Glucose-Capillary 64 (*)    All other components within normal limits  CBG MONITORING, ED - Abnormal; Notable  for the following components:   Glucose-Capillary 66 (*)    All other components within normal limits  CBG MONITORING, ED - Abnormal; Notable for the following components:   Glucose-Capillary 69 (*)    All other components within normal limits  CBG MONITORING, ED  CBG MONITORING, ED  CBG MONITORING, ED    EKG EKG Interpretation  Date/Time:  Wednesday January 18 2018 09:11:39 EST Ventricular Rate:  62 PR Interval:    QRS Duration: 80 QT Interval:  424 QTC Calculation: 431 R Axis:   5 Text Interpretation:  Sinus rhythm Borderline prolonged PR interval RSR' in V1 or V2, right VCD or RVH Since last tracing rate slower Confirmed by Noemi Chapel [phone]) on 01/18/2018 9:21:06 AM   Radiology No results found.  Procedures Procedures (including critical care time)  Medications Ordered in ED Medications  sodium chloride 0.9 % bolus 500 mL (500 mLs Intravenous New Bag/Given 01/18/18 1136)     Initial Impression / Assessment and Plan / ED Course  I have reviewed the triage  vital signs and the nursing notes.  Pertinent labs & imaging results that were available during my care of the patient were reviewed by me and considered in my medical decision making (see chart for details).  Clinical Course as of Jan 18 1346  Wed Jan 18, 2018  6384 Attempted to call son and update him on his father's visit. Son did not answer.    [AH]  1008 Spoke to patient's son on the phone. Updated him on his father's status and plan. Son states he understands and agrees with plan. Son states father can return to [facility] if discharged.    [AH]  1018 BG improved to 79 with orange juice.   Glucose-Capillary: 79 [AH]  1020 Anemia noted with a hemoglobin of 9.4. Patient has chronic anemia noted on previous labs. Patient is not a candidate for a blood transfusion at this time.   Hemoglobin(!): 9.4 [AH]  1056 Glucose improved after recent hypoglycemia.  Glucose(!): 130 [AH]  1100 Creatinine is 1.61. Previous creatinine was 1.26 in 05/2017. Will provide IVF.  Creatinine(!): 1.61 [AH]  1245 Patient has eaten a sandwich, apple sauce, and a sprite.   [AH]  1310 Hypoglycemia noted again. Will repeat with a different device to assess for device malfunction. Patient has been eating throughout these episodes.   CBG monitoring, ED(!) [AH]  1334 Glucose has improved. Will discharge with q2h glucose checks at nursing facility.   Glucose-Capillary: 87 [AH]  1343 Spoke to son and updated him on the patient's discharge. Son understands and agrees with plan.    [AH]    Clinical Course User Index [AH] Julienne Kass   Hemoglobin  Date Value Ref Range Status  01/18/2018 9.4 (L) 13.0 - 17.0 g/dL Final  06/21/2017 10.3 (L) 13.0 - 17.0 g/dL Final  06/29/2014 10.6 (L) 13.0 - 17.0 g/dL Final  01/22/2013 10.4 (L) 13.0 - 17.0 g/dL Final   HGB  Date Value Ref Range Status  10/27/2010 10.8 (L) 13.0 - 17.1 g/dL Final  07/30/2010 12.5 (L) 13.0 - 17.1 g/dL Final   BUN  Date Value Ref  Range Status  01/18/2018 21 8 - 23 mg/dL Final  06/21/2017 25 (H) 6 - 20 mg/dL Final  06/29/2014 20 6 - 20 mg/dL Final  01/22/2013 23 6 - 23 mg/dL Final   Creatinine, Ser  Date Value Ref Range Status  01/18/2018 1.61 (H) 0.61 - 1.24 mg/dL Final  06/21/2017 1.26 (  H) 0.61 - 1.24 mg/dL Final  06/29/2014 1.52 (H) 0.61 - 1.24 mg/dL Final  01/22/2013 1.35 0.50 - 1.35 mg/dL Final    Patient without arrhythmia or tachycardia while here in the department.  Patient without history of congestive heart failure, normal hematocrit, normal ECG, no shortness of breath and systolic blood pressure greater than 90; patient is low risk according to [facility] Syncope Rule. Patient was hypoglycemic at 64 upon arrival. Provided orange juice to improve hypoglycemia. Patient's hypoglycemia improved after eating. Instructed nursing facility to monitor blood glucose every 2 hours. Will plan for discharge home with close PCP follow-up.  Possibility of recurrent syncope has been discussed. I discussed reasons to avoid driving until cardiology/PCP followup and other safety prevention including use of ladders and working at heights. Pt will return to [facility].   Pt has remained hemodynamically stable throughout their time in the ED.  BP 137/83 (BP Location: Left Arm)   Pulse 78   Temp 98.7 F (37.1 C) (Oral)   Resp 20   SpO2 100%   The patient was discussed with and seen by Dr. Sabra Heck who agrees with the treatment plan.  Final Clinical Impressions(s) / ED Diagnoses   Final diagnoses:  Syncope, unspecified syncope type  Hypoglycemia    ED Discharge Orders    None       Arville Lime, [place] 01/18/18 1348    Noemi Chapel, MD 01/19/18 1142

## 2018-01-18 NOTE — ED Notes (Signed)
Pt eating turkey sandwich

## 2018-01-18 NOTE — ED Triage Notes (Signed)
Pt arrives via EMS from [facility], he had a brief period of unresponsiveness at breakfast. By the time EMS arrived, pt back to baseline. Per EMS,. Pt alert but confused at baseline.

## 2018-01-18 NOTE — ED Notes (Signed)
Attempted iv x2, unsuccessful. Second RN to attempt

## 2018-01-18 NOTE — ED Notes (Signed)
See EDP assessment / note.

## 2018-01-18 NOTE — Discharge Instructions (Addendum)
You have been seen today for loss of consciousness. Please read and follow all provided instructions.   1. Medications: usual home medications 2. Treatment: rest, drink plenty of fluids, MONITOR BLOOD GLUCOSE EVERY 2 HOURS. If blood glucose drops below 70, provide orange juice/food. If glucose continues to be low, please seek medical attention.  3. Follow Up: Please follow up with your primary doctor in 2 days for discussion of your diagnoses and further evaluation after today's visit; if you do not have a primary care doctor use the resource guide provided to find one; Please return to the ER for any new or worsening symptoms.   Take medications as prescribed. Return to the emergency room for worsening condition or new concerning symptoms. Follow up with your regular doctor. If you don't have a regular doctor use one of the numbers below to establish a primary care doctor.   Emergency Department Resource Guide 1) Find a Doctor and Pay Out of Pocket Although you won't have to find out who is covered by your insurance plan, it is a good idea to ask around and get recommendations. You will then need to call the office and see if the doctor you have chosen will accept you as a new patient and what types of options they offer for patients who are self-pay. Some doctors offer discounts or will set up payment plans for their patients who do not have insurance, but you will need to ask so you aren't surprised when you get to your appointment.  2) Contact Your Local Health Department Not all health departments have doctors that can see patients for sick visits, but many do, so it is worth a call to see if yours does. If you don't know where your local health department is, you can check in your phone book. The CDC also has a tool to help you locate your state's health department, and many state websites also have listings of all of their local health departments.  3) Find a [facility] If your illness is  not likely to be very severe or complicated, you may want to try a walk in clinic. These are popping up all over the country in pharmacies, drugstores, and shopping centers. They're usually staffed by nurse practitioners or physician assistants that have been trained to treat common illnesses and complaints. They're usually fairly quick and inexpensive. However, if you have serious medical issues or chronic medical problems, these are probably not your best option.  No Primary Care Doctor: Call Health Connect at  [phone] - they can help you locate a primary care doctor that  accepts your insurance, provides certain services, etc. Physician Referral [phone]  Emergency Department Resource Guide 1) Find a Doctor and Pay Out of Pocket Although you won't have to find out who is covered by your insurance plan, it is a good idea to ask around and get recommendations. You will then need to call the office and see if the doctor you have chosen will accept you as a new patient and what types of options they offer for patients who are self-pay. Some doctors offer discounts or will set up payment plans for their patients who do not have insurance, but you will need to ask so you aren't surprised when you get to your appointment.  2) Contact Your Local Health Department Not all health departments have doctors that can see patients for sick visits, but many do, so it is worth a call to see if yours does.  If you don't know where your local health department is, you can check in your phone book. The CDC also has a tool to help you locate your state's health department, and many state websites also have listings of all of their local health departments.  3) Find a [facility] If your illness is not likely to be very severe or complicated, you may want to try a walk in clinic. These are popping up all over the country in pharmacies, drugstores, and shopping centers. They're usually staffed by nurse  practitioners or physician assistants that have been trained to treat common illnesses and complaints. They're usually fairly quick and inexpensive. However, if you have serious medical issues or chronic medical problems, these are probably not your best option.  No Primary Care Doctor: Call Health Connect at  [phone] - they can help you locate a primary care doctor that  accepts your insurance, provides certain services, etc. Physician Referral Service- [phone]  Chronic Pain Problems: Organization         Address  Phone   Notes  [facility]  [phone] Patients need to be referred by their primary care doctor.   Medication Assistance: Organization         Address  Phone   Notes  [facility] Medication [facility] [address] [place], [place] [address] [phone] --Must be a resident of [facility] -- Must have NO insurance coverage whatsoever (no Medicaid/ Medicare, etc.) -- The pt. MUST have a primary care doctor that directs their care regularly and follows them in the community   [facility]  [phone]   [employer]  [phone]    Agencies that provide inexpensive medical care: Organization         Address  Phone   Notes  [place]  [phone]   [facility]    [phone]   [facility] [place], [place] [address] [phone]   [place] [address] [place] [phone]   [facility]    [phone]   [facility]    [phone]   [place] and [place] [address], [place] Phone:  [phone], Fax:  [phone] Hours of Operation:  9 am - 6 pm, M-F.  Also accepts Medicaid/Medicare and self-pay.  [facility] for [place] [address], [address], [place] Phone: [phone], Fax: [phone]. Hours of Operation:  8:30 am - 5:30  pm, M-F.  Also accepts Medicaid and self-pay.  [facility] [place] [address] [place] Phone: [phone]   [place], [place], [place] [phone], Ext. [phone] Mondays & Thursdays: 7-9 AM.  First 15 patients are seen on a first come, first serve basis.    [place] Providers:  Organization         Address  Phone   Notes  [facility] [address], [place] [phone] Also accepts self-pay patients.  [place], [place]  [phone]   [place], [address], [place] [phone]   [facility] [address], [place] [phone]   Lucianne Lei [address], [place]   [phone] Only accepts [place]  Access Medicaid patients after they have their name applied to their card.   Self-Pay (no insurance) in [facility]:  Organization         Address  Phone   Notes  Sickle Cell Patients, [facility] [place] [phone]   [facility] [place] [phone]   [facility] [place]  [place], [place], [place] [phone]   [facility]/Dr. Osei-Bonsu  [address], [place] or [address], [place] [phone] Phone number for both [place] and [place] locations is the same.  [facility] and [facility] [address], [place] [phone]   [facility] [address] [place] or [address] [phone]   [facility] [address], [place] [phone], phone; [phone], fax Sees patients 1st and 3rd Saturday of every month.  Must not qualify for public or private insurance (i.e. Medicaid, Medicare, [place] Health Choice,  Veterans' Benefits)  Household income should be no more than 200% of the poverty level The clinic cannot treat you if you are pregnant or think you are pregnant  Sexually transmitted diseases are not treated at the clinic.

## 2018-04-10 ENCOUNTER — Encounter (HOSPITAL_COMMUNITY): Payer: Self-pay

## 2018-04-10 ENCOUNTER — Emergency Department (HOSPITAL_COMMUNITY): Payer: Medicare HMO

## 2018-04-10 ENCOUNTER — Observation Stay (HOSPITAL_COMMUNITY): Payer: Medicare HMO

## 2018-04-10 ENCOUNTER — Other Ambulatory Visit: Payer: Self-pay

## 2018-04-10 ENCOUNTER — Observation Stay (HOSPITAL_COMMUNITY)
Admission: EM | Admit: 2018-04-10 | Discharge: 2018-04-12 | Disposition: A | Discharge status: Nursing Home (Includes ICF) | Payer: Medicare HMO | Attending: Family Medicine | Admitting: Family Medicine

## 2018-04-10 DIAGNOSIS — G9341 Metabolic encephalopathy: Secondary | ICD-10-CM | POA: Insufficient documentation

## 2018-04-10 DIAGNOSIS — K219 Gastro-esophageal reflux disease without esophagitis: Secondary | ICD-10-CM | POA: Insufficient documentation

## 2018-04-10 DIAGNOSIS — J9601 Acute respiratory failure with hypoxia: Secondary | ICD-10-CM | POA: Diagnosis not present

## 2018-04-10 DIAGNOSIS — I2510 Atherosclerotic heart disease of native coronary artery without angina pectoris: Secondary | ICD-10-CM | POA: Insufficient documentation

## 2018-04-10 DIAGNOSIS — A419 Sepsis, unspecified organism: Secondary | ICD-10-CM | POA: Insufficient documentation

## 2018-04-10 DIAGNOSIS — E860 Dehydration: Secondary | ICD-10-CM

## 2018-04-10 DIAGNOSIS — I129 Hypertensive chronic kidney disease with stage 1 through stage 4 chronic kidney disease, or unspecified chronic kidney disease: Secondary | ICD-10-CM | POA: Diagnosis not present

## 2018-04-10 DIAGNOSIS — R000 Tachycardia, unspecified: Secondary | ICD-10-CM | POA: Diagnosis not present

## 2018-04-10 DIAGNOSIS — R197 Diarrhea, unspecified: Secondary | ICD-10-CM | POA: Diagnosis not present

## 2018-04-10 DIAGNOSIS — R739 Hyperglycemia, unspecified: Secondary | ICD-10-CM | POA: Diagnosis not present

## 2018-04-10 DIAGNOSIS — N200 Calculus of kidney: Secondary | ICD-10-CM | POA: Insufficient documentation

## 2018-04-10 DIAGNOSIS — I700 Atherosclerosis of aorta: Secondary | ICD-10-CM | POA: Insufficient documentation

## 2018-04-10 DIAGNOSIS — J189 Pneumonia, unspecified organism: Secondary | ICD-10-CM | POA: Diagnosis not present

## 2018-04-10 DIAGNOSIS — E876 Hypokalemia: Secondary | ICD-10-CM | POA: Insufficient documentation

## 2018-04-10 DIAGNOSIS — G309 Alzheimer's disease, unspecified: Secondary | ICD-10-CM | POA: Diagnosis not present

## 2018-04-10 DIAGNOSIS — R9431 Abnormal electrocardiogram [ECG] [EKG]: Secondary | ICD-10-CM | POA: Insufficient documentation

## 2018-04-10 DIAGNOSIS — Z79899 Other long term (current) drug therapy: Secondary | ICD-10-CM | POA: Insufficient documentation

## 2018-04-10 DIAGNOSIS — R410 Disorientation, unspecified: Secondary | ICD-10-CM | POA: Diagnosis not present

## 2018-04-10 DIAGNOSIS — R1310 Dysphagia, unspecified: Secondary | ICD-10-CM | POA: Diagnosis not present

## 2018-04-10 DIAGNOSIS — Z791 Long term (current) use of non-steroidal anti-inflammatories (NSAID): Secondary | ICD-10-CM | POA: Insufficient documentation

## 2018-04-10 DIAGNOSIS — N184 Chronic kidney disease, stage 4 (severe): Secondary | ICD-10-CM | POA: Diagnosis not present

## 2018-04-10 DIAGNOSIS — R112 Nausea with vomiting, unspecified: Secondary | ICD-10-CM | POA: Diagnosis not present

## 2018-04-10 DIAGNOSIS — K5730 Diverticulosis of large intestine without perforation or abscess without bleeding: Secondary | ICD-10-CM | POA: Insufficient documentation

## 2018-04-10 LAB — URINALYSIS, ROUTINE W REFLEX MICROSCOPIC
Bilirubin Urine: NEGATIVE
GLUCOSE, UA: NEGATIVE mg/dL
Hgb urine dipstick: NEGATIVE
KETONES UR: NEGATIVE mg/dL
Leukocytes,Ua: NEGATIVE
Nitrite: NEGATIVE
PH: 5 (ref 5.0–8.0)
Protein, ur: NEGATIVE mg/dL
Specific Gravity, Urine: 1.033 — ABNORMAL HIGH (ref 1.005–1.030)

## 2018-04-10 LAB — LACTIC ACID, PLASMA
Lactic Acid, Venous: 3.2 mmol/L (ref 0.5–1.9)
Lactic Acid, Venous: 3.3 mmol/L (ref 0.5–1.9)
Lactic Acid, Venous: 2.1 mmol/L (ref 0.5–1.9)
Lactic Acid, Venous: 2.2 mmol/L (ref 0.5–1.9)

## 2018-04-10 LAB — COMPREHENSIVE METABOLIC PANEL
ALBUMIN: 3.9 g/dL (ref 3.5–5.0)
ALT: 17 U/L (ref 0–44)
ANION GAP: 10 (ref 5–15)
AST: 34 U/L (ref 15–41)
Alkaline Phosphatase: 74 U/L (ref 38–126)
BUN: 29 mg/dL — ABNORMAL HIGH (ref 8–23)
CO2: 23 mmol/L (ref 22–32)
CREATININE: 1.47 mg/dL — AB (ref 0.61–1.24)
Calcium: 9.2 mg/dL (ref 8.9–10.3)
Chloride: 107 mmol/L (ref 98–111)
GFR calc Af Amer: 49 mL/min — ABNORMAL LOW (ref >60)
GFR calc non Af Amer: 42 mL/min — ABNORMAL LOW (ref >60)
GLUCOSE: 166 mg/dL — AB (ref 70–99)
POTASSIUM: 4.4 mmol/L (ref 3.5–5.1)
SODIUM: 140 mmol/L (ref 135–145)
Total Bilirubin: 0.8 mg/dL (ref 0.3–1.2)
Total Protein: 8 g/dL (ref 6.5–8.1)

## 2018-04-10 LAB — PROTIME-INR
INR: 1.05
Prothrombin Time: 13.6 seconds (ref 11.4–15.2)

## 2018-04-10 LAB — CBC
HEMATOCRIT: 35.4 % — AB (ref 39.0–52.0)
Hemoglobin: 11.4 g/dL — ABNORMAL LOW (ref 13.0–17.0)
MCH: 30.8 pg (ref 26.0–34.0)
MCHC: 32.2 g/dL (ref 30.0–36.0)
MCV: 95.7 fL (ref 80.0–100.0)
NRBC: 0 % (ref 0.0–0.2)
Platelets: 165 10*3/uL (ref 150–400)
RBC: 3.7 MIL/uL — ABNORMAL LOW (ref 4.22–5.81)
RDW: 13.8 % (ref 11.5–15.5)
WBC: 5.8 10*3/uL (ref 4.0–10.5)

## 2018-04-10 LAB — C DIFFICILE QUICK SCREEN W PCR REFLEX
C DIFFICILE (CDIFF) TOXIN: NEGATIVE
C Diff antigen: NEGATIVE
C Diff interpretation: NOT DETECTED

## 2018-04-10 LAB — LIPASE, BLOOD: Lipase: 27 U/L (ref 11–51)

## 2018-04-10 MED ORDER — IOPAMIDOL (ISOVUE-300) INJECTION 61%
100.0000 mL | Freq: Once | INTRAVENOUS | Status: AC | PRN
  Administered 2018-04-10: 80 mL via INTRAVENOUS

## 2018-04-10 MED ORDER — DONEPEZIL HCL 10 MG PO TABS
10.0000 mg | ORAL_TABLET | Freq: Every day | ORAL | Status: DC
  Administered 2018-04-11 – 2018-04-12 (×2): 10 mg via ORAL
  Filled 2018-04-10 (×2): qty 1

## 2018-04-10 MED ORDER — SODIUM CHLORIDE 0.9% FLUSH: 3.0000 mL | Freq: Once | INTRAVENOUS | Status: DC

## 2018-04-10 MED ORDER — PIPERACILLIN-TAZOBACTAM 3.375 G IVPB 30 MIN
3.3750 g | Freq: Once | INTRAVENOUS | Status: AC
  Administered 2018-04-10: 3.375 g via INTRAVENOUS
  Filled 2018-04-10: qty 50

## 2018-04-10 MED ORDER — ACETAMINOPHEN 325 MG PO TABS
650.0000 mg | ORAL_TABLET | Freq: Four times a day (QID) | ORAL | Status: DC | PRN
  Administered 2018-04-11: 650 mg via ORAL
  Filled 2018-04-10: qty 2

## 2018-04-10 MED ORDER — SODIUM CHLORIDE 0.9 % IV BOLUS
1000.0000 mL | Freq: Once | INTRAVENOUS | Status: AC
  Administered 2018-04-10: 1000 mL via INTRAVENOUS

## 2018-04-10 MED ORDER — PANTOPRAZOLE SODIUM 40 MG PO TBEC
40.0000 mg | DELAYED_RELEASE_TABLET | Freq: Every day | ORAL | Status: DC
  Administered 2018-04-11 – 2018-04-12 (×2): 40 mg via ORAL
  Filled 2018-04-10 (×2): qty 1

## 2018-04-10 MED ORDER — FERROUS SULFATE 325 (65 FE) MG PO TABS
325.0000 mg | ORAL_TABLET | Freq: Two times a day (BID) | ORAL | Status: DC
  Administered 2018-04-11 – 2018-04-12 (×2): 325 mg via ORAL
  Filled 2018-04-10 (×3): qty 1

## 2018-04-10 MED ORDER — HEPARIN SODIUM (PORCINE) 5000 UNIT/ML IJ SOLN
5000.0000 [IU] | Freq: Three times a day (TID) | INTRAMUSCULAR | Status: DC
  Administered 2018-04-10 – 2018-04-12 (×5): 5000 [IU] via SUBCUTANEOUS
  Filled 2018-04-10 (×5): qty 1

## 2018-04-10 MED ORDER — VANCOMYCIN HCL IN DEXTROSE 1-5 GM/200ML-% IV SOLN
1000.0000 mg | Freq: Once | INTRAVENOUS | Status: AC
  Administered 2018-04-10: 1000 mg via INTRAVENOUS
  Filled 2018-04-10: qty 200

## 2018-04-10 MED ORDER — LACTATED RINGERS IV BOLUS
500.0000 mL | Freq: Once | INTRAVENOUS | Status: AC
  Administered 2018-04-10: 500 mL via INTRAVENOUS

## 2018-04-10 MED ORDER — ACETAMINOPHEN 650 MG RE SUPP
650.0000 mg | Freq: Four times a day (QID) | RECTAL | Status: DC | PRN
  Administered 2018-04-10: 650 mg via RECTAL
  Filled 2018-04-10: qty 1

## 2018-04-10 MED ORDER — SODIUM CHLORIDE (PF) 0.9 % IJ SOLN
INTRAMUSCULAR | Status: AC
  Filled 2018-04-10: qty 50

## 2018-04-10 MED ORDER — LACTATED RINGERS IV SOLN
INTRAVENOUS | Status: AC
  Administered 2018-04-10 – 2018-04-11 (×3): via INTRAVENOUS

## 2018-04-10 MED ORDER — IOPAMIDOL (ISOVUE-300) INJECTION 61%
INTRAVENOUS | Status: AC
  Filled 2018-04-10: qty 100

## 2018-04-10 MED ORDER — VANCOMYCIN HCL IN DEXTROSE 1-5 GM/200ML-% IV SOLN
1000.0000 mg | INTRAVENOUS | Status: DC
  Administered 2018-04-11: 1000 mg via INTRAVENOUS
  Filled 2018-04-10: qty 200

## 2018-04-10 MED ORDER — PIPERACILLIN-TAZOBACTAM 3.375 G IVPB
3.3750 g | Freq: Three times a day (TID) | INTRAVENOUS | Status: DC
  Administered 2018-04-10 – 2018-04-11 (×2): 3.375 g via INTRAVENOUS
  Filled 2018-04-10 (×4): qty 50

## 2018-04-10 MED ORDER — DIVALPROEX SODIUM 125 MG PO CSDR
250.0000 mg | DELAYED_RELEASE_CAPSULE | Freq: Two times a day (BID) | ORAL | Status: DC
  Administered 2018-04-11 – 2018-04-12 (×3): 250 mg via ORAL
  Filled 2018-04-10 (×4): qty 2
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
## 2018-04-10 NOTE — ED Notes (Signed)
Date and time results received: 04/10/18 13:25  Test: Lactic Acid  Critical Value: 3.3  Name of Provider Notified: Dr. Sharmon Leyden   Orders Received? Or Actions Taken?: Continue to monitor patient and await new orders.

## 2018-04-10 NOTE — Progress Notes (Signed)
Pharmacy Antibiotic Note  Raymond Hickman is a 85 y.o. male admitted on 04/10/2018 with LA 3.3.  Nursing home reports diarrhea last night, pt has hx of alzheimers.  Pharmacy has been consulted for zosyn dosing for sepsis. CrCl ~ 25mls/min  Plan: Zosyn 3.375gm x 1 in ED then q8h extended interval Pharmacy will sign off and follow peripherally for renal adjustment     Temp (24hrs), Avg:99.9 F (37.7 C), Min:99.9 F (37.7 C), Max:99.9 F (37.7 C)  Recent Labs  Lab 04/10/18 1012 04/10/18 1251  WBC 5.8  --   CREATININE 1.47*  --   LATICACIDVEN 3.2* 3.3*    CrCl cannot be calculated (Unknown ideal weight.).    No Known Allergies  Antimicrobials this admission: 2/17 zosyn >> Dose adjustments this admission:   Microbiology results: 2/17 BCx:  2/17 CDiff: neg  Thank you for allowing pharmacy to be a part of this patient's care.  Dolly Rias RPh 04/10/2018, 2:17 PM Pager [phone]

## 2018-04-10 NOTE — ED Notes (Signed)
CRITICAL VALUE STICKER  CRITICAL VALUE:  RECEIVER (on-site recipient of call):  DATE & TIME NOTIFIED: 04/10/18 1059   MD NOTIFIED: Roderic Palau, MD  TIME OF NOTIFICATION:04/10/18  RESPONSE: See new orders

## 2018-04-10 NOTE — ED Notes (Signed)
Bed: WA06 Expected date:  Expected time:  Means of arrival:  Comments: EMS 

## 2018-04-10 NOTE — Progress Notes (Signed)
Patient transferred from ED with MEWS score of 4. MD has been notified, latic Acid has improved and vital signs remain within the same range, as when patient first arrived to the floor. Will continue to monitor.  04/10/18 1732  MEWS Assessment  Is this an acute change? No

## 2018-04-10 NOTE — ED Notes (Signed)
ED TO INPATIENT HANDOFF REPORT  Name/Age/Gender Raymond Hickman 85 y.o. male  Code Status Code Status History    Date Active Date Inactive Code Status Order ID Comments User Context   01/21/2013 1633 01/22/2013 1354 Full Code [id number]  [name], MD Inpatient      Home/SNF/Other [facility]   Chief Complaint Nausea; Emesis; Diarrhea  Level of Care/Admitting Diagnosis ED Disposition    ED Disposition Condition Comment   Admit  Hospital Area: [facility] [[id number]]  Level of Care: Telemetry [5]  Admit to tele based on following criteria: Other see comments  Comments: sepsis  Diagnosis: Pneumonia [539767]  Admitting Physician: Elodia Florence [phone]  Attending Physician: Cephus Slater, A CALDWELL [phone]  PT Class (Do Not Modify): Observation [104]  PT Acc Code (Do Not Modify): Observation [10022]       Medical History Past Medical History:  Diagnosis Date  . Allergic rhinitis, cause unspecified   . DDD (degenerative disc disease), cervical   . Dementia (HCC)    Mild  . GERD (gastroesophageal reflux disease)   . Hypertension     Allergies No Known Allergies  IV Location/Drains/Wounds Patient Lines/Drains/Airways Status   Active Line/Drains/Airways    Name:   Placement date:   Placement time:   Site:   Days:   Peripheral IV 04/10/18 Left Antecubital   04/10/18    1017    Antecubital   less than 1          Labs/Imaging Results for orders placed or performed during the hospital encounter of 04/10/18 (from the past 48 hour(s))  Lipase, blood     Status: None   Collection Time: 04/10/18 10:12 AM  Result Value Ref Range   Lipase 27 11 - 51 U/L    Comment: Performed at [facility], [place] [address] [place], [place] [address]  Comprehensive metabolic panel     Status: Abnormal   Collection Time: 04/10/18 10:12 AM  Result Value Ref Range   Sodium 140 135 - 145 mmol/L   Potassium 4.4 3.5 - 5.1 mmol/L   Chloride 107 98 - 111 mmol/L   CO2 23 22 - 32 mmol/L   Glucose, Bld 166 (H) 70 - 99 mg/dL   BUN 29 (H) 8 - 23 mg/dL   Creatinine, Ser 1.47 (H) 0.61 - 1.24 mg/dL   Calcium 9.2 8.9 - 10.3 mg/dL   Total Protein 8.0 6.5 - 8.1 g/dL   Albumin 3.9 3.5 - 5.0 g/dL   AST 34 15 - 41 U/L   ALT 17 0 - 44 U/L   Alkaline Phosphatase 74 38 - 126 U/L   Total Bilirubin 0.8 0.3 - 1.2 mg/dL   GFR calc non Af Amer 42 (L) >60 mL/min   GFR calc Af Amer 49 (L) >60 mL/min   Anion gap 10 5 - 15    Comment: Performed at [facility], [place] [address] [place], [place], [place] [address]  CBC     Status: Abnormal   Collection Time: 04/10/18 10:12 AM  Result Value Ref Range   WBC 5.8 4.0 - 10.5 K/uL   RBC 3.70 (L) 4.22 - 5.81 MIL/uL   Hemoglobin 11.4 (L) 13.0 - 17.0 g/dL   HCT 35.4 (L) 39.0 - 52.0 %   MCV 95.7 80.0 - 100.0 fL   MCH 30.8 26.0 - 34.0 pg   MCHC 32.2 30.0 - 36.0 g/dL   RDW 13.8 11.5 - 15.5 %  Platelets 165 150 - 400 K/uL   nRBC 0.0 0.0 - 0.2 %    Comment: Performed at [facility] [place], [place] [address], [place], [place] [address]  Lactic acid, plasma     Status: Abnormal   Collection Time: 04/10/18 10:12 AM  Result Value Ref Range   Lactic Acid, Venous 3.2 (HH) 0.5 - 1.9 mmol/L    Comment: CRITICAL RESULT CALLED TO, READ BACK BY AND VERIFIED WITH: Cha Gomillion,M. RN AT 1057 04/10/18 MULLINS,T Performed at [facility], [place] [address], [place], [place] [address]   Protime-INR     Status: None   Collection Time: 04/10/18 10:12 AM  Result Value Ref Range   Prothrombin Time 13.6 11.4 - 15.2 seconds   INR 1.05     Comment: Performed at [facility], [place] [address] [place], [place] [address]  C difficile quick scan w PCR reflex     Status: None   Collection Time: 04/10/18 11:17 AM  Result Value Ref Range   C Diff antigen NEGATIVE NEGATIVE   C Diff toxin NEGATIVE NEGATIVE   C Diff interpretation No C. difficile detected.     Comment:  Performed at [facility], [place] [address] [place], [place] [address]  Lactic acid, plasma     Status: Abnormal   Collection Time: 04/10/18 12:51 PM  Result Value Ref Range   Lactic Acid, Venous 3.3 (HH) 0.5 - 1.9 mmol/L    Comment: CRITICAL RESULT CALLED TO, READ BACK BY AND VERIFIED WITHMilas Gain RN AT 1324 04/10/18 MULLINS,T Performed at [facility], [place] [address], [place], [place] [address]   Urinalysis, Routine w reflex microscopic     Status: Abnormal   Collection Time: 04/10/18  1:05 PM  Result Value Ref Range   Color, Urine YELLOW YELLOW   APPearance CLEAR CLEAR   Specific Gravity, Urine 1.033 (H) 1.005 - 1.030   pH 5.0 5.0 - 8.0   Glucose, UA NEGATIVE NEGATIVE mg/dL   Hgb urine dipstick NEGATIVE NEGATIVE   Bilirubin Urine NEGATIVE NEGATIVE   Ketones, ur NEGATIVE NEGATIVE mg/dL   Protein, ur NEGATIVE NEGATIVE mg/dL   Nitrite NEGATIVE NEGATIVE   Leukocytes,Ua NEGATIVE NEGATIVE    Comment: Performed at [place] [address], [place], [place] [address]   Ct Abdomen Pelvis W Contrast  Result Date: 04/10/2018 CLINICAL DATA:  85 year old male with history of agitation. Abdominal distension. EXAM: CT ABDOMEN AND PELVIS WITH CONTRAST TECHNIQUE: Multidetector CT imaging of the abdomen and pelvis was performed using the standard protocol following bolus administration of intravenous contrast. CONTRAST:  72mL ISOVUE-300 IOPAMIDOL (ISOVUE-300) INJECTION 61% COMPARISON:  No priors. FINDINGS: Lower chest: Patchy areas of peribronchovascular ground-glass attenuation are noted in the posterior aspect of the right middle lobe and in the medial segment of the right lower lobe, likely infectious or inflammatory in etiology. Atherosclerotic calcifications in the left anterior descending and right coronary artery. Hepatobiliary: 7 mm low-attenuation lesion in the central aspect of segment 6 of the liver, too small to characterize, but  statistically likely a tiny cyst. No other suspicious cystic or solid hepatic lesions. No intra or extrahepatic biliary ductal dilatation. Gallbladder is normal in appearance. Pancreas: No pancreatic mass. No pancreatic ductal dilatation. No pancreatic or peripancreatic fluid or inflammatory changes. Spleen: Unremarkable. Adrenals/Urinary Tract: Mild multifocal cortical thinning in both kidneys. 6 mm nonobstructive calculus in the upper pole collecting system of the right kidney. No suspicious renal lesions. No hydroureteronephrosis.  Urinary bladder is normal in appearance. Bilateral adrenal glands are normal in appearance. Stomach/Bowel: Stomach is normal in appearance. No pathologic dilatation of small bowel or colon. Few scattered colonic diverticulae are noted, without surrounding inflammatory changes to suggest an acute diverticulitis at this time. The appendix is not confidently identified and may be surgically absent. Regardless, there are no inflammatory changes noted adjacent to the cecum to suggest the presence of an acute appendicitis at this time. Vascular/Lymphatic: Aortic atherosclerosis, without evidence of aneurysm or dissection in the abdominal or pelvic vasculature. No lymphadenopathy noted in the abdomen or pelvis. Reproductive: Prostate gland and seminal vesicles are unremarkable in appearance. Other: No significant volume of ascites.  No pneumoperitoneum. Musculoskeletal: There are no aggressive appearing lytic or blastic lesions noted in the visualized portions of the skeleton. IMPRESSION: 1. No acute findings are noted in the abdomen or pelvis to account for the patient's symptoms. 2. However, there are patchy areas of mild peribronchovascular ground-glass attenuation in the right middle and lower lobes, likely of infectious or inflammatory etiology. Clinical correlation for signs and symptoms of aspiration is suggested. Further evaluation with swallow study per speech therapy could also be  considered if clinically appropriate. 3. Mild colonic diverticulosis without evidence of acute diverticulitis at this time. 4. Aortic atherosclerosis, in addition to least 2 vessel coronary artery disease. 5. 6 mm nonobstructive calculus in the upper pole collecting system of the right kidney. Electronically Signed   By: Vinnie Langton M.D.   On: 04/10/2018 12:11   Dg Chest Port 1 View  Result Date: 04/10/2018 CLINICAL DATA:  Weakness EXAM: PORTABLE CHEST 1 VIEW COMPARISON:  None. FINDINGS: The heart size and mediastinal contours are within normal limits. Both lungs are clear. The visualized skeletal structures are unremarkable. IMPRESSION: No active disease. Electronically Signed   By: Franchot Gallo M.D.   On: 04/10/2018 11:46   None  Pending Labs Unresulted Labs (From admission, onward)    Start     Ordered   04/10/18 1500  Lactic acid, plasma  STAT Now then every 3 hours,   R     04/10/18 1401   04/10/18 1012  Culture, blood (Routine x 2)  BLOOD CULTURE X 2,   STAT     04/10/18 1012   Signed and Held  CBC  (heparin)  Once,   R    Comments:  Baseline for heparin therapy IF NOT ALREADY DRAWN.  Notify MD if PLT < 100 K.    Signed and Held   Signed and Held  Creatinine, serum  (heparin)  Once,   R    Comments:  Baseline for heparin therapy IF NOT ALREADY DRAWN.    Signed and Held   Signed and Held  Comprehensive metabolic panel  Tomorrow morning,   R     Signed and Held   Signed and Held  CBC  Tomorrow morning,   R     Signed and Held          Vitals/Pain Today's Vitals   04/10/18 1300 04/10/18 1330 04/10/18 1346 04/10/18 1400  BP: 131/82 140/70 140/70 134/90  Pulse:   (!) 101 97  Resp: (!) 22 19 15 20   Temp:      TempSrc:      SpO2:   100% 100%    Isolation Precautions Enteric precautions (UV disinfection)  Medications Medications  sodium chloride flush (NS) 0.9 % injection 3 mL (has no administration in time range)  sodium chloride flush (NS) 0.9 %  injection 3 mL  (has no administration in time range)  iopamidol (ISOVUE-300) 61 % injection (has no administration in time range)  sodium chloride (PF) 0.9 % injection (has no administration in time range)  sodium chloride 0.9 % bolus 1,000 mL (1,000 mLs Intravenous Bolus 04/10/18 1405)  lactated ringers bolus 500 mL (has no administration in time range)  lactated ringers infusion (has no administration in time range)  sodium chloride 0.9 % bolus 1,000 mL (1,000 mLs Intravenous Bolus 04/10/18 1104)  piperacillin-tazobactam (ZOSYN) IVPB 3.375 g (0 g Intravenous Stopped 04/10/18 1228)  iopamidol (ISOVUE-300) 61 % injection 100 mL (80 mLs Intravenous Contrast Given 04/10/18 1138)    Mobility manual wheelchair

## 2018-04-10 NOTE — ED Notes (Signed)
Condom cath has been placed. Pt was educated.   Pt was cleaned upon arrival.

## 2018-04-10 NOTE — Progress Notes (Signed)
Pharmacy Antibiotic Note  Raymond Hickman is a 85 y.o. male admitted on 04/10/2018 with pneumonia.  Pharmacy has been consulted for vancomycin dosing; patient already on Zosyn  Plan:  Vancomycin 1000 mg IV q36 hr (est AUC 481 based on SCr 1.47, Vd 0.72)  Measure vancomycin AUC at steady state as indicated  Continue Zosyn extended infusion as ordered  Patient is at high risk of AKI given baseline dysfunction plus the combination of vanc/Zosyn. Would consider Cefepime +/- Flagyl in place of Zosyn - will monitor SCr daily in the meantime  MRSA PCR ordered, f/u results   Height: 5\' 9"  (175.3 cm) Weight: 125 lb 11.2 oz (57 kg) IBW/kg (Calculated) : 70.7  Temp (24hrs), Avg:99.4 F (37.4 C), Min:97.7 F (36.5 C), Max:100.4 F (38 C)  Recent Labs  Lab 04/10/18 1012 04/10/18 1251 04/10/18 1500 04/10/18 1738  WBC 5.8  --   --   --   CREATININE 1.47*  --   --   --   LATICACIDVEN 3.2* 3.3* 2.1* 2.2*    Estimated Creatinine Clearance: 28.5 mL/min (A) (by C-G formula based on SCr of 1.47 mg/dL (H)).    No Known Allergies  Antimicrobials this admission: 2/17 Zosyn >>  2/17 vanc >>   Dose adjustments this admission: n/a  Microbiology results: 2/17 BCx: sent 2/17Cdiff: neg/neg 2/17 MRSA PCR: pending collection  Thank you for allowing pharmacy to be a part of this patient's care.  Reuel Boom, PharmD, BCPS [phone] 04/10/2018, 8:27 PM

## 2018-04-10 NOTE — Progress Notes (Signed)
MD notified through [facility] page that latic acid is critical 2.1.

## 2018-04-10 NOTE — H&P (Addendum)
History and Physical    Raymond Hickman ACZ:[id number] DOB: December 30, 1929 DOA: 04/10/2018  PCP: Dixie Dials, MD Patient coming from: facility, [facility]  I have personally briefly reviewed patient's old medical records in [place]  Chief Complaint: altered mental status, diarrhea  HPI: Raymond Hickman is a 85 y.o. male with medical history significant of dementia, reflux, hypertension presenting from [place] today with acute encephalopathy and diarrhea.  History is limited as the patient is unable to contribute because of his confusions.  The patient's son at bedside also does not know why he presented to the hospital.  I called [place] and discussed with 1 of his caregivers.  They note that it was reported that he was having diarrhea all night per the third shift.  He had a large bowel movement in bed in the morning.  They gave him a shower.  After that, at breakfast he was sitting in the chair not eating.  He was slumped over.  He had an episode of nausea and vomiting.  After this, EMS was called and he came to the hospital.  ED Course: Labs, imaging, cultures, abx.  Admit to hospitalist for concern for GI infection.    Review of Systems: unable to obtain due to pt mental status.  Past Medical History:  Diagnosis Date  . Allergic rhinitis, cause unspecified   . DDD (degenerative disc disease), cervical   . Dementia (HCC)    Mild  . GERD (gastroesophageal reflux disease)   . Hypertension     History reviewed. No pertinent surgical history.   reports that he has never smoked. He has never used smokeless tobacco. He reports that he does not drink alcohol or use drugs.  No Known Allergies  History reviewed. No pertinent family history.  Prior to Admission medications   Medication Sig Start Date End Date Taking? Authorizing Provider  acetaminophen (TYLENOL) 325 MG tablet Take 650 mg by mouth 3 (three) times daily as needed for mild pain or moderate pain.    Yes  [provider]  amLODipine (NORVASC) 5 MG tablet Take 5 mg by mouth daily.   Yes [provider]  clotrimazole (LOTRIMIN) 1 % cream Apply 1 application topically 2 (two) times daily. Apply to both feet x 4 weeks   Yes [provider]  divalproex (DEPAKOTE SPRINKLE) 125 MG capsule Take 250 mg by mouth 2 (two) times daily.    Yes [provider]  docusate sodium (COLACE) 100 MG capsule Take 100 mg by mouth daily.   Yes [provider]  ferrous sulfate 325 (65 FE) MG tablet Take 325 mg by mouth 2 (two) times daily with a meal.   Yes [provider]  LORazepam (ATIVAN) 0.5 MG tablet Take 0.5 mg by mouth 2 (two) times daily as needed (for Agitation).   Yes [provider]  omeprazole (PRILOSEC) 20 MG capsule Take 1 capsule (20 mg total) by mouth daily. Patient taking differently: Take 20 mg by mouth 2 (two) times daily before a meal.  01/22/13  Yes [name], MD  Vitamin D, Ergocalciferol, (DRISDOL) 1.25 MG (50000 UT) CAPS capsule Take 50,000 Units by mouth every 7 (seven) days.   Yes [provider]  amLODipine (NORVASC) 2.5 MG tablet Take 1 tablet (2.5 mg total) by mouth daily. Patient taking differently: Take 5 mg by mouth daily.  01/22/13   [name], MD  diclofenac (VOLTAREN) 50 MG EC tablet Take 1 tablet (50 mg total) by  mouth 2 (two) times daily. Patient not taking: Reported on 06/29/2014 01/22/13   [name], MD  donepezil (ARICEPT) 10 MG tablet Take 1 tablet (10 mg total) by mouth daily. Patient not taking: Reported on 04/10/2018 01/22/13   [name], MD  HYDROcodone-acetaminophen (NORCO/VICODIN) 5-325 MG per tablet Take 1 tablet by mouth every 6 (six) hours as needed for moderate pain. Patient not taking: Reported on 06/21/2017 01/22/13   [name], MD    Physical Exam: Vitals:   04/10/18 1300 04/10/18 1330 04/10/18 1346 04/10/18 1400  BP: 131/82 140/70 140/70 134/90  Pulse:   (!)  101 97  Resp: (!) 22 19 15 20   Temp:      TempSrc:      SpO2:   100% 100%    Constitutional: NAD, somnolent Vitals:   04/10/18 1300 04/10/18 1330 04/10/18 1346 04/10/18 1400  BP: 131/82 140/70 140/70 134/90  Pulse:   (!) 101 97  Resp: (!) 22 19 15 20   Temp:      TempSrc:      SpO2:   100% 100%   Eyes: PERRL, lids and conjunctivae normal ENMT: Mucous membranes are moist. Posterior pharynx clear of any exudate or lesions.Normal dentition.  Neck: normal, supple, no masses, no thyromegaly Respiratory: clear to auscultation bilaterally, no wheezing, no crackles  Cardiovascular: Regular rate and rhythm, no murmurs / rubs / gallops. No extremity edema.  Abdomen: no tenderness, no masses palpated. No hepatosplenomegaly. Bowel sounds positive.  Musculoskeletal: no clubbing / cyanosis. No joint deformity upper and lower extremities. Good ROM, no contractures. Normal muscle tone.  Skin: no rashes, lesions, ulcers. No induration Neurologic: Difficult exam as pt somnolent.  Not following commands.  Did not awaken with sternal rub, but with irritative stimuli to foot, pt opened eyes and looked at me, moving all extremities.  Psychiatric: unable to assess  Labs on Admission: I have personally reviewed following labs and imaging studies  CBC: Recent Labs  Lab 04/10/18 1012  WBC 5.8  HGB 11.4*  HCT 35.4*  MCV 95.7  PLT 244   Basic Metabolic Panel: Recent Labs  Lab 04/10/18 1012  NA 140  K 4.4  CL 107  CO2 23  GLUCOSE 166*  BUN 29*  CREATININE 1.47*  CALCIUM 9.2   GFR: CrCl cannot be calculated (Unknown ideal weight.). Liver Function Tests: Recent Labs  Lab 04/10/18 1012  AST 34  ALT 17  ALKPHOS 74  BILITOT 0.8  PROT 8.0  ALBUMIN 3.9   Recent Labs  Lab 04/10/18 1012  LIPASE 27   No results for input(s): AMMONIA in the last 168 hours. Coagulation Profile: Recent Labs  Lab 04/10/18 1012  INR 1.05   Cardiac Enzymes: No results for input(s): CKTOTAL, CKMB,  CKMBINDEX, TROPONINI in the last 168 hours. BNP (last 3 results) No results for input(s): PROBNP in the last 8760 hours. HbA1C: No results for input(s): HGBA1C in the last 72 hours. CBG: No results for input(s): GLUCAP in the last 168 hours. Lipid Profile: No results for input(s): CHOL, HDL, LDLCALC, TRIG, CHOLHDL, LDLDIRECT in the last 72 hours. Thyroid Function Tests: No results for input(s): TSH, T4TOTAL, FREET4, T3FREE, THYROIDAB in the last 72 hours. Anemia Panel: No results for input(s): VITAMINB12, FOLATE, FERRITIN, TIBC, IRON, RETICCTPCT in the last 72 hours. Urine analysis:    Component Value Date/Time   COLORURINE YELLOW 04/10/2018 1305   APPEARANCEUR CLEAR 04/10/2018 1305   LABSPEC 1.033 (H) 04/10/2018 1305   PHURINE 5.0  04/10/2018 [place] 04/10/2018 [place] 05/19/2009 1345   [place] 04/10/2018 [place] 04/10/2018 1305   KETONESUR NEGATIVE 04/10/2018 1305   PROTEINUR NEGATIVE 04/10/2018 1305   UROBILINOGEN 0.2 07/21/2010 2058   NITRITE NEGATIVE 04/10/2018 1305   LEUKOCYTESUR NEGATIVE 04/10/2018 1305    Radiological Exams on Admission: Ct Abdomen Pelvis W Contrast  Result Date: 04/10/2018 CLINICAL DATA:  85 year old male with history of agitation. Abdominal distension. EXAM: CT ABDOMEN AND PELVIS WITH CONTRAST TECHNIQUE: Multidetector CT imaging of the abdomen and pelvis was performed using the standard protocol following bolus administration of intravenous contrast. CONTRAST:  64mL ISOVUE-300 IOPAMIDOL (ISOVUE-300) INJECTION 61% COMPARISON:  No priors. FINDINGS: Lower chest: Patchy areas of peribronchovascular ground-glass attenuation are noted in the posterior aspect of the right middle lobe and in the medial segment of the right lower lobe, likely infectious or inflammatory in etiology. Atherosclerotic calcifications in the left anterior descending and right coronary artery. Hepatobiliary: 7 mm low-attenuation  lesion in the central aspect of segment 6 of the liver, too small to characterize, but statistically likely a tiny cyst. No other suspicious cystic or solid hepatic lesions. No intra or extrahepatic biliary ductal dilatation. Gallbladder is normal in appearance. Pancreas: No pancreatic mass. No pancreatic ductal dilatation. No pancreatic or peripancreatic fluid or inflammatory changes. Spleen: Unremarkable. Adrenals/Urinary Tract: Mild multifocal cortical thinning in both kidneys. 6 mm nonobstructive calculus in the upper pole collecting system of the right kidney. No suspicious renal lesions. No hydroureteronephrosis. Urinary bladder is normal in appearance. Bilateral adrenal glands are normal in appearance. Stomach/Bowel: Stomach is normal in appearance. No pathologic dilatation of small bowel or colon. Few scattered colonic diverticulae are noted, without surrounding inflammatory changes to suggest an acute diverticulitis at this time. The appendix is not confidently identified and may be surgically absent. Regardless, there are no inflammatory changes noted adjacent to the cecum to suggest the presence of an acute appendicitis at this time. Vascular/Lymphatic: Aortic atherosclerosis, without evidence of aneurysm or dissection in the abdominal or pelvic vasculature. No lymphadenopathy noted in the abdomen or pelvis. Reproductive: Prostate gland and seminal vesicles are unremarkable in appearance. Other: No significant volume of ascites.  No pneumoperitoneum. Musculoskeletal: There are no aggressive appearing lytic or blastic lesions noted in the visualized portions of the skeleton. IMPRESSION: 1. No acute findings are noted in the abdomen or pelvis to account for the patient's symptoms. 2. However, there are patchy areas of mild peribronchovascular ground-glass attenuation in the right middle and lower lobes, likely of infectious or inflammatory etiology. Clinical correlation for signs and symptoms of aspiration  is suggested. Further evaluation with swallow study per speech therapy could also be considered if clinically appropriate. 3. Mild colonic diverticulosis without evidence of acute diverticulitis at this time. 4. Aortic atherosclerosis, in addition to least 2 vessel coronary artery disease. 5. 6 mm nonobstructive calculus in the upper pole collecting system of the right kidney. Electronically Signed   By: Vinnie Langton M.D.   On: 04/10/2018 12:11   Dg Chest Port 1 View  Result Date: 04/10/2018 CLINICAL DATA:  Weakness EXAM: PORTABLE CHEST 1 VIEW COMPARISON:  None. FINDINGS: The heart size and mediastinal contours are within normal limits. Both lungs are clear. The visualized skeletal structures are unremarkable. IMPRESSION: No active disease. Electronically Signed   By: Franchot Gallo M.D.   On: 04/10/2018 11:46    EKG: Independently reviewed. Sinus rhythm. LAD.  Appears similar to priors.  Assessment/Plan  Active Problems:   Pneumonia  Sepsis  Possible Pneumonia: fever, tachycardia.  No WBC.  Mild tachypnea.  Pt also altered.  ? Aspiration.  With diarrhea, N/V below, question viral gastroenteritis with aspiration pneumonia now? UA bland.  Workup thus far only notable for possible pneumonia. Continue vanc/zosyn for now, deescalate as able Follow MRSA, sputum cx Follow blood cx Speech evaluation S/p IVF, follow lactate  Nausea  Vomiting  Diarrhea: symptoms seem to have started with diarrhea overnight.  C diff negative.  Continue to monitor.  CT abdomen without significant findings.  Acute Metabolic Encephalopathy  Dementia:  Likely 2/2 above.  Head CT without acute findings. Treat infection as noted above Additional w/u as indicated Delirium precautions Continue depakote and aricept for dementia Currently holding ativan  CKD IV: creatinine appears to be around baseline.  Follow with IVF  Hypertension: holding amlodipine in setting of sepsis  Hyperglycemia: follow Hba1c  GERD:  PPI  Goals of care: discussed code status with pt son/POA.  He'd like to talk to family before making any decisions.  Pt currently full code.  Follow up discussion with family.   DVT prophylaxis: heparin  Code Status: full   Family Communication: son at bedside  Disposition Plan: pending improvement Consults called: none  Admission status: obs    Fayrene Helper MD [facility] Pager AMION  If 7PM-7AM, please contact night-coverage [email or web address] Password [facility]  04/10/2018, 2:11 PM

## 2018-04-11 DIAGNOSIS — F0281 Dementia in other diseases classified elsewhere with behavioral disturbance: Secondary | ICD-10-CM

## 2018-04-11 DIAGNOSIS — G9341 Metabolic encephalopathy: Secondary | ICD-10-CM | POA: Diagnosis not present

## 2018-04-11 DIAGNOSIS — G20 Parkinson's disease: Secondary | ICD-10-CM

## 2018-04-11 DIAGNOSIS — J189 Pneumonia, unspecified organism: Secondary | ICD-10-CM | POA: Diagnosis not present

## 2018-04-11 DIAGNOSIS — J690 Pneumonitis due to inhalation of food and vomit: Secondary | ICD-10-CM | POA: Diagnosis not present

## 2018-04-11 LAB — CBC
HCT: 27.8 % — ABNORMAL LOW (ref 39.0–52.0)
Hemoglobin: 9 g/dL — ABNORMAL LOW (ref 13.0–17.0)
MCH: 31.3 pg (ref 26.0–34.0)
MCHC: 32.4 g/dL (ref 30.0–36.0)
MCV: 96.5 fL (ref 80.0–100.0)
Platelets: 123 10*3/uL — ABNORMAL LOW (ref 150–400)
RBC: 2.88 MIL/uL — ABNORMAL LOW (ref 4.22–5.81)
RDW: 13.9 % (ref 11.5–15.5)
WBC: 4.3 10*3/uL (ref 4.0–10.5)
nRBC: 0 % (ref 0.0–0.2)

## 2018-04-11 LAB — LACTIC ACID, PLASMA: Lactic Acid, Venous: 1.5 mmol/L (ref 0.5–1.9)

## 2018-04-11 LAB — COMPREHENSIVE METABOLIC PANEL
ALK PHOS: 49 U/L (ref 38–126)
ALT: 15 U/L (ref 0–44)
AST: 31 U/L (ref 15–41)
Albumin: 3 g/dL — ABNORMAL LOW (ref 3.5–5.0)
Anion gap: 7 (ref 5–15)
BILIRUBIN TOTAL: 0.7 mg/dL (ref 0.3–1.2)
BUN: 20 mg/dL (ref 8–23)
CO2: 24 mmol/L (ref 22–32)
Calcium: 8.4 mg/dL — ABNORMAL LOW (ref 8.9–10.3)
Chloride: 107 mmol/L (ref 98–111)
Creatinine, Ser: 1.64 mg/dL — ABNORMAL HIGH (ref 0.61–1.24)
GFR calc Af Amer: 43 mL/min — ABNORMAL LOW (ref >60)
GFR calc non Af Amer: 37 mL/min — ABNORMAL LOW (ref >60)
Glucose, Bld: 96 mg/dL (ref 70–99)
Potassium: 3.5 mmol/L (ref 3.5–5.1)
Sodium: 138 mmol/L (ref 135–145)
TOTAL PROTEIN: 6.1 g/dL — AB (ref 6.5–8.1)

## 2018-04-11 LAB — MRSA PCR SCREENING: MRSA by PCR: NEGATIVE

## 2018-04-11 MED ORDER — METRONIDAZOLE IN NACL 5-0.79 MG/ML-% IV SOLN
500.0000 mg | Freq: Three times a day (TID) | INTRAVENOUS | Status: DC
  Administered 2018-04-11 – 2018-04-12 (×3): 500 mg via INTRAVENOUS
  Filled 2018-04-11 (×3): qty 100

## 2018-04-11 MED ORDER — SODIUM CHLORIDE 0.9 % IV SOLN
1.0000 g | INTRAVENOUS | Status: DC
  Administered 2018-04-11: 1 g via INTRAVENOUS
  Filled 2018-04-11 (×2): qty 1

## 2018-04-11 MED ORDER — SODIUM CHLORIDE 0.9% FLUSH: 10.0000 mL | INTRAVENOUS | Status: DC | PRN

## 2018-04-11 MED ORDER — SODIUM CHLORIDE 0.9% FLUSH
10.0000 mL | Freq: Two times a day (BID) | INTRAVENOUS | Status: DC
  Administered 2018-04-11: 10 mL
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
## 2018-04-11 NOTE — Progress Notes (Signed)
Pharmacy Antibiotic Note  Raymond Hickman is a 85 y.o. male admitted on 04/10/2018 with pneumonia.  Pharmacy has been consulted for vancomycin dosing; patient already on Zosyn  Today, 04/11/18   Contacted Dr. Louanne Belton, who agreed to change Zosyn to cefepime/metrondiazole in light of pt poor renal function   Scr had bumped up from 1.47 to 1.64     Plan:  Vancomycin 1000 mg IV q36 hr (est AUC 481 based on SCr 1.47, Vd 0.72)  Metronidazole 500 mg IV q8h  Continue Zosyn extended infusion as ordered  Cefepime 1 gr IV q24h  MRSA PCR ordered, f/u results   Height: 5\' 9"  (175.3 cm) Weight: 127 lb 10.3 oz (57.9 kg) IBW/kg (Calculated) : 70.7  Temp (24hrs), Avg:99.2 F (37.3 C), Min:97.7 F (36.5 C), Max:101 F (38.3 C)  Recent Labs  Lab 04/10/18 1012 04/10/18 1251 04/10/18 1500 04/10/18 1738 04/11/18 0607  WBC 5.8  --   --   --  4.3  CREATININE 1.47*  --   --   --  1.64*  LATICACIDVEN 3.2* 3.3* 2.1* 2.2*  --     Estimated Creatinine Clearance: 26 mL/min (A) (by C-G formula based on SCr of 1.64 mg/dL (H)).    No Known Allergies  Antimicrobials this admission: 2/17 Zosyn >> 2/18 2/17 vanc >>  2/18 cefepime >>  2/18 metronidazole >>  Dose adjustments this admission: n/a  Microbiology results: 2/17 BCx: NGTD 2/17Cdiff: neg/neg 2/17 MRSA PCR: negative   Thank you for allowing pharmacy to be a part of this patient's care.   Royetta Asal, PharmD, BCPS Pager [phone] 04/11/2018 1:40 PM

## 2018-04-11 NOTE — Evaluation (Addendum)
Clinical/Bedside Swallow Evaluation Patient Details  Name: [name] MRN: [id number] Date of Birth: 1929-05-25  Today's Date: 04/11/2018 Time: Raymond Start Time (ACUTE ONLY): 0820 Raymond Stop Time (ACUTE ONLY): 0850 Raymond Time Calculation (min) (ACUTE ONLY): 30 min  Past Medical History:  Past Medical History:  Diagnosis Date  . Allergic rhinitis, cause unspecified   . DDD (degenerative disc disease), cervical   . Dementia (HCC)    Mild  . GERD (gastroesophageal reflux disease)   . Hypertension    Past Surgical History: History reviewed. No pertinent surgical history. HPI:  85 yo male adm to [facility] confused after slumping over at meal and having nausea/vomiting - Now concern for pna.  Pt has dementia and resides at [facility].  Swallow eval ordered. Pt on a regular diet prior to admit.  His CT chest concerning for right lower and middle lobe ground glass changes - ? Inflammatory process/pna.    Assessment / Plan / Recommendation Clinical Impression  Pt with negative CN exam and does have decreased vocal amplitude when speaking. Pt able to self feed *Raymond removed mitt and replaced when finished with po intake*.   No indication of aspiration with po observed *2 packets of crackers, 4 ounces applesauce, 2 sips of liquids.   He does demonstrate possible oropharyngeal swallow delay with liquids - ? if cognitive based.  Recommend advance diet as tolerated - ? if MD desires to start with clears given n/v prior to admit.  If pt aspirated prior to admit, possibly was due to nausea and vomiting.    Will follow up x1 given delayed probable discoordinated swallow and concern for pna. Advised RN to recommendations.   Raymond Visit Diagnosis: Dysphagia, unspecified (R13.10)    Aspiration Risk  Mild aspiration risk    Diet Recommendation Regular;Thin liquid   Liquid Administration via: Straw;Cup Medication Administration: Whole meds with puree Supervision: Patient able to self feed Compensations: Small  sips/bites;Slow rate Postural Changes: Seated upright at 90 degrees;Remain upright for at least 30 minutes after po intake    Other  Recommendations Oral Care Recommendations: Oral care BID   Follow up Recommendations None      Frequency and Duration min 1 x/week  1 week       Prognosis Prognosis for Safe Diet Advancement: Good Barriers to Reach Goals: Cognitive deficits      Swallow Study   General Date of Onset: 04/11/18 HPI: 85 yo male adm to [facility] confused after slumping over at meal and having nausea/vomiting - Now concern for pna.  Pt has dementia and resides at [facility].  Swallow eval ordered. Type of Study: Bedside Swallow Evaluation Diet Prior to this Study: NPO Temperature Spikes Noted: Yes Respiratory Status: Room air History of Recent Intubation: No Behavior/Cognition: Alert;Cooperative;Pleasant mood Oral Cavity Assessment: Within Functional Limits Oral Care Completed by Raymond: No Oral Cavity - Dentition: Dentures, top;Dentures, bottom Vision: Functional for self-feeding Self-Feeding Abilities: Able to feed self Patient Positioning: Upright in bed Baseline Vocal Quality: Low vocal intensity Volitional Cough: Cognitively unable to elicit Volitional Swallow: Unable to elicit    Oral/Motor/Sensory Function Overall Oral Motor/Sensory Function: Within functional limits   Ice Chips Ice chips: Not tested   Thin Liquid Thin Liquid: Impaired Oral Phase Functional Implications: Oral holding(inferred delay) Pharyngeal  Phase Impairments: Suspected delayed Swallow(inferred delay)    Nectar Thick Nectar Thick Liquid: Not tested   Honey Thick Honey Thick Liquid: Not tested   Puree Puree: Within functional limits Presentation: Spoon;Self Fed Other Comments: 4  ounces   Solid     Solid: Within functional limits Presentation: Self Fed Other Comments: pt consumed 2 packets of graham crackers      Raymond Hickman 04/11/2018,9:05 AM   Raymond Salk, MS [facility]  Raymond [facility] Pager [phone] Office [phone]

## 2018-04-12 DIAGNOSIS — J181 Lobar pneumonia, unspecified organism: Secondary | ICD-10-CM | POA: Diagnosis not present

## 2018-04-12 LAB — CBC
HCT: 27.2 % — ABNORMAL LOW (ref 39.0–52.0)
Hemoglobin: 8.7 g/dL — ABNORMAL LOW (ref 13.0–17.0)
MCH: 30.1 pg (ref 26.0–34.0)
MCHC: 32 g/dL (ref 30.0–36.0)
MCV: 94.1 fL (ref 80.0–100.0)
Platelets: 128 10*3/uL — ABNORMAL LOW (ref 150–400)
RBC: 2.89 MIL/uL — ABNORMAL LOW (ref 4.22–5.81)
RDW: 13.2 % (ref 11.5–15.5)
WBC: 4.4 10*3/uL (ref 4.0–10.5)
nRBC: 0 % (ref 0.0–0.2)

## 2018-04-12 LAB — COMPREHENSIVE METABOLIC PANEL
ALT: 15 U/L (ref 0–44)
AST: 42 U/L — ABNORMAL HIGH (ref 15–41)
Albumin: 3 g/dL — ABNORMAL LOW (ref 3.5–5.0)
Alkaline Phosphatase: 42 U/L (ref 38–126)
Anion gap: 6 (ref 5–15)
BUN: 15 mg/dL (ref 8–23)
CO2: 25 mmol/L (ref 22–32)
Calcium: 8.5 mg/dL — ABNORMAL LOW (ref 8.9–10.3)
Chloride: 106 mmol/L (ref 98–111)
Creatinine, Ser: 1.48 mg/dL — ABNORMAL HIGH (ref 0.61–1.24)
GFR calc Af Amer: 49 mL/min — ABNORMAL LOW (ref >60)
GFR calc non Af Amer: 42 mL/min — ABNORMAL LOW (ref >60)
Glucose, Bld: 92 mg/dL (ref 70–99)
Potassium: 2.8 mmol/L — ABNORMAL LOW (ref 3.5–5.1)
SODIUM: 137 mmol/L (ref 135–145)
Total Bilirubin: 0.7 mg/dL (ref 0.3–1.2)
Total Protein: 6.2 g/dL — ABNORMAL LOW (ref 6.5–8.1)

## 2018-04-12 LAB — HEMOGLOBIN A1C
Hgb A1c MFr Bld: 5.7 % — ABNORMAL HIGH (ref 4.8–5.6)
Mean Plasma Glucose: 117 mg/dL

## 2018-04-12 LAB — MAGNESIUM: Magnesium: 1.7 mg/dL (ref 1.7–2.4)

## 2018-04-12 MED ORDER — AMOXICILLIN-POT CLAVULANATE 500-125 MG PO TABS: 500.0000 mg | ORAL_TABLET | Freq: Two times a day (BID) | ORAL | 0 refills | Status: AC

## 2018-04-12 MED ORDER — LORAZEPAM 0.5 MG PO TABS: 0.5000 mg | ORAL_TABLET | Freq: Two times a day (BID) | ORAL | 0 refills | Status: DC | PRN

## 2018-04-12 MED ORDER — AMOXICILLIN-POT CLAVULANATE 500-125 MG PO TABS
500.0000 mg | ORAL_TABLET | Freq: Two times a day (BID) | ORAL | Status: DC
  Administered 2018-04-12: 500 mg via ORAL
  Filled 2018-04-12 (×2): qty 1

## 2018-04-12 MED ORDER — MAGNESIUM SULFATE 2 GM/50ML IV SOLN
2.0000 g | Freq: Once | INTRAVENOUS | Status: AC
  Administered 2018-04-12: 2 g via INTRAVENOUS
  Filled 2018-04-12: qty 50

## 2018-04-12 MED ORDER — POTASSIUM CHLORIDE CRYS ER 20 MEQ PO TBCR
30.0000 meq | EXTENDED_RELEASE_TABLET | ORAL | Status: AC
  Administered 2018-04-12 (×2): 30 meq via ORAL
  Filled 2018-04-12 (×2): qty 1

## 2018-04-12 NOTE — NC FL2 (Addendum)
[place] LEVEL OF CARE SCREENING TOOL     IDENTIFICATION  Patient Name: Raymond Hickman Birthdate: 18-Aug-1929 Sex: male Admission Date (Current Location): 04/10/2018  [facility] and [place] Number:  Herbalist and Address:  [facility],  [place] [address] [place]      Provider Number: [id number]  Attending Physician Name and Address:  Patrecia Pour, MD  Relative Name and Phone Number:       Current Level of Care: Hospital Recommended Level of Care: Memory Care Prior Approval Number:    Date Approved/Denied:   PASRR Number:    Discharge Plan: Other (Comment)(Memory Care- ALF)    Current Diagnoses: Patient Active Problem List   Diagnosis Date Noted  . Pneumonia 04/10/2018  . Skin rash   . GERD with stricture 12/04/2010  . Atypical lymphocytosis 07/22/2010  . GERD 12/23/2009  . GENERALIZED OSTEOARTHROSIS UNSPECIFIED SITE 12/23/2009  . Dementia ([place]) 09/15/2009  . DEGENERATIVE JOINT DISEASE 08/01/2009  . WEIGHT LOSS 08/01/2009  . NECK PAIN 06/10/2009  . CHEST PAIN UNSPECIFIED 06/10/2009  . ALLERGIC RHINITIS 03/17/2009  . HTN (hypertension) 03/17/2009    Orientation RESPIRATION BLADDER Height & Weight     Self  Normal Incontinent Weight: 125 lb (56.7 kg) Height:  5\' 9"  (175.3 cm)  BEHAVIORAL SYMPTOMS/MOOD NEUROLOGICAL BOWEL NUTRITION STATUS      Incontinent Diet(See dc summary)  AMBULATORY STATUS COMMUNICATION OF NEEDS Skin   Limited Assist Verbally(Responds to voice) Normal                       Personal Care Assistance Level of Assistance  Bathing, Feeding, Dressing Bathing Assistance: Limited assistance Feeding assistance: Independent Dressing Assistance: Limited assistance     Functional Limitations Info  Sight, Hearing, Speech Sight Info: Adequate Hearing Info: Impaired Speech Info: Adequate    SPECIAL CARE FACTORS FREQUENCY                       Contractures Contractures Info: Not  present    Additional Factors Info  Code Status, Allergies Code Status Info: Full Allergies Info: NKA           Current Medications (04/12/2018):  This is the current hospital active medication list Current Facility-Administered Medications  Medication Dose Route Frequency Provider Last Rate Last Dose  . acetaminophen (TYLENOL) tablet 650 mg  650 mg Oral Q6H PRN Elodia Florence., MD   650 mg at 04/11/18 2115   Or  . acetaminophen (TYLENOL) suppository 650 mg  650 mg Rectal Q6H PRN Elodia Florence., MD   650 mg at 04/10/18 2115  . amoxicillin-clavulanate (AUGMENTIN) 500-125 MG per tablet 500 mg  500 mg Oral BID Vance Gather B, MD      . divalproex (DEPAKOTE SPRINKLE) capsule 250 mg  250 mg Oral BID Elodia Florence., MD   250 mg at 04/11/18 2110  . donepezil (ARICEPT) tablet 10 mg  10 mg Oral Daily Elodia Florence., MD   10 mg at 04/11/18 0936  . ferrous sulfate tablet 325 mg  325 mg Oral BID WC Elodia Florence., MD   325 mg at 04/11/18 1717  . heparin injection 5,000 Units  5,000 Units Subcutaneous Q8H Elodia Florence., MD   5,000 Units at 04/12/18 [phone]  . pantoprazole (PROTONIX) EC tablet 40 mg  40 mg Oral Daily Elodia Florence., MD   40  mg at 04/11/18 0936  . potassium chloride (K-DUR,KLOR-CON) CR tablet 30 mEq  30 mEq Oral Q4H Kirby-Graham, Karsten Fells, NP   30 mEq at 04/12/18 0654  . sodium chloride flush (NS) 0.9 % injection 10-40 mL  10-40 mL Intracatheter Q12H Pokhrel, Laxman, MD   10 mL at 04/11/18 2200  . sodium chloride flush (NS) 0.9 % injection 10-40 mL  10-40 mL Intracatheter PRN Pokhrel, Laxman, MD      . sodium chloride flush (NS) 0.9 % injection 3 mL  3 mL Intravenous Once Elodia Florence., MD      . sodium chloride flush (NS) 0.9 % injection 3 mL  3 mL Intravenous Once Elodia Florence., MD         Discharge Medications: Please see discharge summary for a list of discharge medications.  Relevant Imaging  Results:  Relevant Lab Results:   Additional Information ssn: [id number]  Servando Snare, LCSW

## 2018-04-12 NOTE — Progress Notes (Signed)
Report called to [facility].

## 2018-04-12 NOTE — NC FL2 (Signed)
Raymond Hickman LEVEL OF CARE SCREENING TOOL     IDENTIFICATION  Patient Name: Raymond Hickman Birthdate: 1930/02/18 Sex: male Admission Date (Current Location): 04/10/2018  [facility] and [place] Number:  Herbalist and Address:  [facility],  [place] [address] [place]      Provider Number: [id number]  Attending Physician Name and Address:  Raymond Pour, MD  Relative Name and Phone Number:       Current Level of Care: Hospital Recommended Level of Care: Memory Care Prior Approval Number:    Date Approved/Denied:   PASRR Number:    Discharge Plan: Other (Comment)(Memory Care- ALF)    Current Diagnoses: Patient Active Problem List   Diagnosis Date Noted  . Pneumonia 04/10/2018  . Skin rash   . GERD with stricture 12/04/2010  . Atypical lymphocytosis 07/22/2010  . GERD 12/23/2009  . GENERALIZED OSTEOARTHROSIS UNSPECIFIED SITE 12/23/2009  . Dementia ([place]) 09/15/2009  . DEGENERATIVE JOINT DISEASE 08/01/2009  . WEIGHT LOSS 08/01/2009  . NECK PAIN 06/10/2009  . CHEST PAIN UNSPECIFIED 06/10/2009  . ALLERGIC RHINITIS 03/17/2009  . HTN (hypertension) 03/17/2009    Orientation RESPIRATION BLADDER Height & Weight     Self  Normal Incontinent Weight: 125 lb (56.7 kg) Height:  5\' 9"  (175.3 cm)  BEHAVIORAL SYMPTOMS/MOOD NEUROLOGICAL BOWEL NUTRITION STATUS      Incontinent Diet(See dc summary)  AMBULATORY STATUS COMMUNICATION OF NEEDS Skin   Limited Assist Verbally(Responds to voice) Normal                       Personal Care Assistance Level of Assistance  Bathing, Feeding, Dressing Bathing Assistance: Limited assistance Feeding assistance: Independent Dressing Assistance: Limited assistance     Functional Limitations Info  Sight, Hearing, Speech Sight Info: Adequate Hearing Info: Impaired Speech Info: Adequate    SPECIAL CARE FACTORS FREQUENCY                       Contractures Contractures Info: Not  present    Additional Factors Info  Code Status, Allergies Code Status Info: Full Allergies Info: NKA           Current Medications (04/12/2018):  This is the current hospital active medication list Current Facility-Administered Medications  Medication Dose Route Frequency Provider Last Rate Last Dose  . acetaminophen (TYLENOL) tablet 650 mg  650 mg Oral Q6H PRN Raymond Hickman., MD   650 mg at 04/11/18 2115   Or  . acetaminophen (TYLENOL) suppository 650 mg  650 mg Rectal Q6H PRN Raymond Hickman., MD   650 mg at 04/10/18 2115  . amoxicillin-clavulanate (AUGMENTIN) 500-125 MG per tablet 500 mg  500 mg Oral BID Raymond Pour, MD   500 mg at 04/12/18 1015  . divalproex (DEPAKOTE SPRINKLE) capsule 250 mg  250 mg Oral BID Raymond Hickman., MD   250 mg at 04/12/18 1015  . donepezil (ARICEPT) tablet 10 mg  10 mg Oral Daily Raymond Hickman., MD   10 mg at 04/12/18 1015  . ferrous sulfate tablet 325 mg  325 mg Oral BID WC Raymond Hickman., MD   325 mg at 04/12/18 1016  . heparin injection 5,000 Units  5,000 Units Subcutaneous Q8H Raymond Hickman., MD   5,000 Units at 04/12/18 [phone]  . pantoprazole (PROTONIX) EC tablet 40 mg  40 mg Oral Daily Raymond Hickman., MD  40 mg at 04/12/18 1016  . sodium chloride flush (NS) 0.9 % injection 10-40 mL  10-40 mL Intracatheter Q12H Hickman, Laxman, MD   10 mL at 04/11/18 2200  . sodium chloride flush (NS) 0.9 % injection 10-40 mL  10-40 mL Intracatheter PRN Hickman, Laxman, MD      . sodium chloride flush (NS) 0.9 % injection 3 mL  3 mL Intravenous Once Raymond Hickman., MD      . sodium chloride flush (NS) 0.9 % injection 3 mL  3 mL Intravenous Once Raymond Hickman., MD         Discharge Medications: STOP taking these medications   diclofenac 50 MG EC tablet Commonly known as:  VOLTAREN   HYDROcodone-acetaminophen 5-325 MG tablet Commonly known as:  NORCO/VICODIN     TAKE these medications    acetaminophen 325 MG tablet Commonly known as:  TYLENOL Take 650 mg by mouth 3 (three) times daily as needed for mild pain or moderate pain.   amLODipine 5 MG tablet Commonly known as:  NORVASC Take 5 mg by mouth daily. What changed:  Another medication with the same name was removed. Continue taking this medication, and follow the directions you see here.   amoxicillin-clavulanate 500-125 MG tablet Commonly known as:  AUGMENTIN Take 1 tablet (500 mg total) by mouth 2 (two) times daily for 4 days. Start taking on:  April 13, 2018   clotrimazole 1 % cream Commonly known as:  LOTRIMIN Apply 1 application topically 2 (two) times daily. Apply to both feet x 4 weeks   divalproex 125 MG capsule Commonly known as:  DEPAKOTE SPRINKLE Take 250 mg by mouth 2 (two) times daily.   docusate sodium 100 MG capsule Commonly known as:  COLACE Take 100 mg by mouth daily.   donepezil 10 MG tablet Commonly known as:  ARICEPT Take 1 tablet (10 mg total) by mouth daily.   ferrous sulfate 325 (65 FE) MG tablet Take 325 mg by mouth 2 (two) times daily with a meal.   LORazepam 0.5 MG tablet Commonly known as:  ATIVAN Take 1 tablet (0.5 mg total) by mouth 2 (two) times daily as needed (for Agitation).   omeprazole 20 MG capsule Commonly known as:  PRILOSEC Take 1 capsule (20 mg total) by mouth daily. What changed:  when to take this   Vitamin D (Ergocalciferol) 1.25 MG (50000 UT) Caps capsule Commonly known as:  DRISDOL Take 50,000 Units by mouth every 7 (seven) days.     Relevant Imaging Results:  Relevant Lab Results:   Additional Information ssn: [id number]  Raymond Snare, LCSW

## 2018-04-12 NOTE — Clinical Social Work Placement (Signed)
   Patient returning to [facility].  LCSW faxed dc docs to facility.   Patient to transport by [facility].   RN report #: [id number]  [facility]  CLINICAL SOCIAL WORK PLACEMENT  NOTE  Date:  04/12/2018  Patient Details  Name: Raymond Hickman MRN: [id number] Date of Birth: 1929/09/09  Clinical Social Work is seeking post-discharge placement for this patient at the   level of care (*CSW will initial, date and re-position this form in  chart as items are completed):      Patient/family provided with [facility] Work Department's list of facilities offering this level of care within the geographic area requested by the patient (or if unable, by the patient's family).      Patient/family informed of their freedom to choose among providers that offer the needed level of care, that participate in Medicare, Medicaid or managed care program needed by the patient, have an available bed and are willing to accept the patient.      Patient/family informed of [facility]'s ownership interest in [facility] and [facility], as well as of the fact that they are under no obligation to receive care at these facilities.  PASRR submitted to [employer] on       PASRR number received on       Existing PASRR number confirmed on       FL2 transmitted to all facilities in geographic area requested by pt/family on       FL2 transmitted to all facilities within larger geographic area on       Patient informed that his/her managed care company has contracts with or will negotiate with certain facilities, including the following:            Patient/family informed of bed offers received.  Patient chooses bed at       Physician recommends and patient chooses bed at      Patient to be transferred to   on  .  Patient to be transferred to facility by       Patient family notified on   of transfer.  Name of family member notified:        PHYSICIAN       Additional Comment:     _______________________________________________ Servando Snare, LCSW 04/12/2018, 2:58 PM

## 2018-04-12 NOTE — Clinical Social Work Note (Signed)
Clinical Social Work Assessment  Patient Details  Name: Raymond Hickman MRN: [id number] Date of Birth: [birth year]  Date of referral:  04/12/18               Reason for consult:  Discharge Planning                Permission sought to share information with:  Facility Sport and exercise psychologist, Family Supports Permission granted to share information::  No(pt not oriented. )  Name::     Recruitment consultant::  [employer]  Relationship::  son  Contact Information:     Housing/Transportation Living arrangements for the past 2 months:  Assisted Living Facility(Memory care) Source of Information:  Facility Patient Interpreter Needed:  None Criminal Activity/Legal Involvement Pertinent to Current Situation/Hospitalization:  No - Comment as needed Significant Relationships:  Adult Children, Warehouse manager Lives with:  Facility Resident Do you feel safe going back to the place where you live?    Need for family participation in patient care:     Care giving concerns:  No care giving concerns at the time of assessment.    Social Worker assessment / plan:  LCSW following for dc planning.   Patient is from [facility] memory care. According to facility patient has been a resident a little over 6 mos. At baseline patient can feed himself but requires assistance in all ADLs. Patient uses a cane to assist with ambulation.   LCSW left message for son Dwayne for collateral.   PLAN: Patient will return to [facility] at [employer].   Employment status:  Retired Nurse, adult PT Recommendations:  Not assessed at this time Information / Referral to community resources:     Patient/Family's Response to care:  Unable to assess.   Patient/Family's Understanding of and Emotional Response to Diagnosis, Current Treatment, and Prognosis:  Unable to assess.   Emotional Assessment Appearance:  Appears stated age Attitude/Demeanor/Rapport:  Unable to Assess Affect (typically  observed):  Calm Orientation:  Oriented to Self Alcohol / Substance use:  Not Applicable Psych involvement (Current and /or in the community):  No (Comment)  Discharge Needs  Concerns to be addressed:  No discharge needs identified Readmission within the last 30 days:  No Current discharge risk:  None Barriers to Discharge:  No Barriers Identified   Servando Snare, LCSW 04/12/2018, 10:04 AM
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
## 2018-04-15 LAB — CULTURE, BLOOD (ROUTINE X 2)
Culture: NO GROWTH
Culture: NO GROWTH
SPECIAL REQUESTS: ADEQUATE
Special Requests: ADEQUATE

## 2018-10-01 ENCOUNTER — Emergency Department (HOSPITAL_COMMUNITY): Payer: Medicare HMO

## 2018-10-01 ENCOUNTER — Other Ambulatory Visit: Payer: Self-pay

## 2018-10-01 ENCOUNTER — Inpatient Hospital Stay (HOSPITAL_COMMUNITY)
Admission: EM | Admit: 2018-10-01 | Discharge: 2018-10-05 | DRG: 871 | Disposition: A | Discharge status: Skilled Nursing Facility | Payer: Medicare HMO | Source: Skilled Nursing Facility | Attending: Family Medicine | Admitting: Family Medicine

## 2018-10-01 ENCOUNTER — Encounter (HOSPITAL_COMMUNITY): Payer: Self-pay

## 2018-10-01 DIAGNOSIS — R6520 Severe sepsis without septic shock: Secondary | ICD-10-CM | POA: Diagnosis present

## 2018-10-01 DIAGNOSIS — D509 Iron deficiency anemia, unspecified: Secondary | ICD-10-CM | POA: Diagnosis present

## 2018-10-01 DIAGNOSIS — E878 Other disorders of electrolyte and fluid balance, not elsewhere classified: Secondary | ICD-10-CM | POA: Diagnosis present

## 2018-10-01 DIAGNOSIS — E162 Hypoglycemia, unspecified: Secondary | ICD-10-CM | POA: Diagnosis present

## 2018-10-01 DIAGNOSIS — F0390 Unspecified dementia without behavioral disturbance: Secondary | ICD-10-CM | POA: Diagnosis present

## 2018-10-01 DIAGNOSIS — N183 Chronic kidney disease, stage 3 unspecified: Secondary | ICD-10-CM | POA: Diagnosis present

## 2018-10-01 DIAGNOSIS — Z20828 Contact with and (suspected) exposure to other viral communicable diseases: Secondary | ICD-10-CM | POA: Diagnosis present

## 2018-10-01 DIAGNOSIS — A419 Sepsis, unspecified organism: Secondary | ICD-10-CM | POA: Diagnosis not present

## 2018-10-01 DIAGNOSIS — K219 Gastro-esophageal reflux disease without esophagitis: Secondary | ICD-10-CM | POA: Diagnosis present

## 2018-10-01 DIAGNOSIS — R112 Nausea with vomiting, unspecified: Secondary | ICD-10-CM | POA: Diagnosis not present

## 2018-10-01 DIAGNOSIS — N179 Acute kidney failure, unspecified: Secondary | ICD-10-CM | POA: Diagnosis present

## 2018-10-01 DIAGNOSIS — Z833 Family history of diabetes mellitus: Secondary | ICD-10-CM | POA: Diagnosis not present

## 2018-10-01 DIAGNOSIS — N6323 Unspecified lump in the left breast, lower outer quadrant: Secondary | ICD-10-CM | POA: Diagnosis present

## 2018-10-01 DIAGNOSIS — E872 Acidosis: Secondary | ICD-10-CM | POA: Diagnosis present

## 2018-10-01 DIAGNOSIS — F419 Anxiety disorder, unspecified: Secondary | ICD-10-CM | POA: Diagnosis present

## 2018-10-01 DIAGNOSIS — I10 Essential (primary) hypertension: Secondary | ICD-10-CM | POA: Diagnosis present

## 2018-10-01 DIAGNOSIS — I129 Hypertensive chronic kidney disease with stage 1 through stage 4 chronic kidney disease, or unspecified chronic kidney disease: Secondary | ICD-10-CM | POA: Diagnosis present

## 2018-10-01 DIAGNOSIS — G9341 Metabolic encephalopathy: Secondary | ICD-10-CM | POA: Diagnosis present

## 2018-10-01 LAB — CBC WITH DIFFERENTIAL/PLATELET
Abs Immature Granulocytes: 0.02 10*3/uL (ref 0.00–0.07)
Basophils Absolute: 0 10*3/uL (ref 0.0–0.1)
Basophils Relative: 0 %
Eosinophils Absolute: 0 10*3/uL (ref 0.0–0.5)
Eosinophils Relative: 0 %
HCT: 35 % — ABNORMAL LOW (ref 39.0–52.0)
Hemoglobin: 11.5 g/dL — ABNORMAL LOW (ref 13.0–17.0)
Immature Granulocytes: 0 %
Lymphocytes Relative: 16 %
Lymphs Abs: 1 10*3/uL (ref 0.7–4.0)
MCH: 30.8 pg (ref 26.0–34.0)
MCHC: 32.9 g/dL (ref 30.0–36.0)
MCV: 93.8 fL (ref 80.0–100.0)
Monocytes Absolute: 0.4 10*3/uL (ref 0.1–1.0)
Monocytes Relative: 6 %
Neutro Abs: 4.7 10*3/uL (ref 1.7–7.7)
Neutrophils Relative %: 78 %
Platelets: 180 10*3/uL (ref 150–400)
RBC: 3.73 MIL/uL — ABNORMAL LOW (ref 4.22–5.81)
RDW: 13.6 % (ref 11.5–15.5)
WBC: 6.1 10*3/uL (ref 4.0–10.5)
nRBC: 0 % (ref 0.0–0.2)

## 2018-10-01 LAB — POC OCCULT BLOOD, ED: Fecal Occult Bld: NEGATIVE

## 2018-10-01 LAB — URINALYSIS, ROUTINE W REFLEX MICROSCOPIC
Bacteria, UA: NONE SEEN
Bilirubin Urine: NEGATIVE
Glucose, UA: NEGATIVE mg/dL
Ketones, ur: 5 mg/dL — AB
Leukocytes,Ua: NEGATIVE
Nitrite: NEGATIVE
Protein, ur: NEGATIVE mg/dL
Specific Gravity, Urine: 1.016 (ref 1.005–1.030)
pH: 6 (ref 5.0–8.0)

## 2018-10-01 LAB — COMPREHENSIVE METABOLIC PANEL
ALT: 27 U/L (ref 0–44)
AST: 39 U/L (ref 15–41)
Albumin: 3.8 g/dL (ref 3.5–5.0)
Alkaline Phosphatase: 64 U/L (ref 38–126)
Anion gap: 15 (ref 5–15)
BUN: 28 mg/dL — ABNORMAL HIGH (ref 8–23)
CO2: 23 mmol/L (ref 22–32)
Calcium: 9.8 mg/dL (ref 8.9–10.3)
Chloride: 102 mmol/L (ref 98–111)
Creatinine, Ser: 1.75 mg/dL — ABNORMAL HIGH (ref 0.61–1.24)
GFR calc Af Amer: 40 mL/min — ABNORMAL LOW (ref >60)
GFR calc non Af Amer: 34 mL/min — ABNORMAL LOW (ref >60)
Glucose, Bld: 124 mg/dL — ABNORMAL HIGH (ref 70–99)
Potassium: 4.4 mmol/L (ref 3.5–5.1)
Sodium: 140 mmol/L (ref 135–145)
Total Bilirubin: 0.6 mg/dL (ref 0.3–1.2)
Total Protein: 8.4 g/dL — ABNORMAL HIGH (ref 6.5–8.1)

## 2018-10-01 LAB — POCT I-STAT 7, (LYTES, BLD GAS, ICA,H+H)
Acid-base deficit: 3 mmol/L — ABNORMAL HIGH (ref 0.0–2.0)
Bicarbonate: 19.6 mmol/L — ABNORMAL LOW (ref 20.0–28.0)
Calcium, Ion: 1.22 mmol/L (ref 1.15–1.40)
HCT: 32 % — ABNORMAL LOW (ref 39.0–52.0)
Hemoglobin: 10.9 g/dL — ABNORMAL LOW (ref 13.0–17.0)
O2 Saturation: 96 %
Potassium: 3.9 mmol/L (ref 3.5–5.1)
Sodium: 141 mmol/L (ref 135–145)
TCO2: 20 mmol/L — ABNORMAL LOW (ref 22–32)
pCO2 arterial: 28 mmHg — ABNORMAL LOW (ref 32.0–48.0)
pH, Arterial: 7.454 — ABNORMAL HIGH (ref 7.350–7.450)
pO2, Arterial: 79 mmHg — ABNORMAL LOW (ref 83.0–108.0)

## 2018-10-01 LAB — SARS CORONAVIRUS 2 BY RT PCR (HOSPITAL ORDER, PERFORMED IN ~~LOC~~ HOSPITAL LAB): SARS Coronavirus 2: NEGATIVE

## 2018-10-01 LAB — LACTIC ACID, PLASMA
Lactic Acid, Venous: 4.8 mmol/L (ref 0.5–1.9)
Lactic Acid, Venous: 2.7 mmol/L (ref 0.5–1.9)

## 2018-10-01 LAB — PROTIME-INR
INR: 1.2 (ref 0.8–1.2)
Prothrombin Time: 14.9 seconds (ref 11.4–15.2)

## 2018-10-01 MED ORDER — SODIUM CHLORIDE 0.9 % IV BOLUS
30.0000 mL/kg | Freq: Once | INTRAVENOUS | Status: AC
  Administered 2018-10-01: 1836 mL via INTRAVENOUS

## 2018-10-01 MED ORDER — ONDANSETRON HCL 4 MG/2ML IJ SOLN: 4.0000 mg | Freq: Three times a day (TID) | INTRAMUSCULAR | Status: DC | PRN

## 2018-10-01 MED ORDER — SODIUM CHLORIDE 0.9 % IV SOLN
2.0000 g | INTRAVENOUS | Status: DC
  Administered 2018-10-02 – 2018-10-03 (×2): 2 g via INTRAVENOUS
  Filled 2018-10-01 (×3): qty 2

## 2018-10-01 MED ORDER — METRONIDAZOLE IN NACL 5-0.79 MG/ML-% IV SOLN
500.0000 mg | Freq: Once | INTRAVENOUS | Status: AC
  Administered 2018-10-01: 500 mg via INTRAVENOUS
  Filled 2018-10-01: qty 100

## 2018-10-01 MED ORDER — HALOPERIDOL LACTATE 5 MG/ML IJ SOLN: 1.0000 mg | Freq: Once | INTRAMUSCULAR | Status: DC | PRN

## 2018-10-01 MED ORDER — SODIUM CHLORIDE 0.9 % IV BOLUS
1000.0000 mL | Freq: Once | INTRAVENOUS | Status: AC
  Administered 2018-10-01: 1000 mL via INTRAVENOUS

## 2018-10-01 MED ORDER — ACETAMINOPHEN 650 MG RE SUPP
650.0000 mg | Freq: Once | RECTAL | Status: AC
  Administered 2018-10-01: 650 mg via RECTAL
  Filled 2018-10-01: qty 1

## 2018-10-01 MED ORDER — FAMOTIDINE IN NACL 20-0.9 MG/50ML-% IV SOLN
20.0000 mg | Freq: Two times a day (BID) | INTRAVENOUS | Status: DC
  Administered 2018-10-02: 20 mg via INTRAVENOUS
  Filled 2018-10-01: qty 50

## 2018-10-01 MED ORDER — ONDANSETRON HCL 4 MG/2ML IJ SOLN
4.0000 mg | Freq: Once | INTRAMUSCULAR | Status: AC
  Administered 2018-10-01: 4 mg via INTRAVENOUS
  Filled 2018-10-01: qty 2

## 2018-10-01 MED ORDER — SODIUM CHLORIDE 0.9 % IV SOLN
2.0000 g | Freq: Once | INTRAVENOUS | Status: AC
  Administered 2018-10-01: 2 g via INTRAVENOUS
  Filled 2018-10-01: qty 2

## 2018-10-01 NOTE — ED Notes (Signed)
Patient is resting comfortably. 

## 2018-10-01 NOTE — ED Notes (Signed)
Ct notified for pt for studies.

## 2018-10-01 NOTE — ED Notes (Signed)
Dr. Ralene Bathe aaware of lactic. States to draw repeat when 1 liter ns infused.

## 2018-10-01 NOTE — ED Triage Notes (Signed)
Pt arrives EMS from [facility] with c/o vomiting x 2 and fever today. Pt is at baseline per EMS, Minimal verbal and advanced dementia.
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
## 2018-10-01 NOTE — H&P (Signed)
History and Physical    Raymond Hickman [id number] DOB: 03-17-1929 DOA: 10/01/2018  Referring MD/NP/PA:   PCP: Dixie Dials, MD   Patient coming from:  The patient is coming from SNF.  At baseline, pt is dependent for most of ADL.        Chief Complaint: Nausea, vomiting, fever  HPI: Raymond Hickman is a 85 y.o. male with medical history significant of hypertension, GERD, advanced dementia, anxiety, DDD, iron deficiency anemia, who presents with nausea, vomiting and fever.  Patient has advanced dementia, and is unable to provide accurate medical history, therefore, most of the history is obtained by discussing the case with ED physician, per EMS report, and with the nursing staff. I also called his daughter-in-law (his son was at side of her daughter-in-law when he talked on the phone).   It seems that pt was noted to have nausea and vomited several times in the facility.  He was noted to have fever.  His temperature is 101.4 in ED.  Per his daughter-in-law, patient does not have chest pain, shortness breath, cough, no symptoms of UTI.  No respiratory distress noted in ED.  Patient moves all extremities normally.  No leg edema.  No neck rigidity.  Per her daughter-in-law, at normal baseline patient can recognize family members, mostly not orientated to time and place.  Currently patient is not orientated x3. Of note, his daughter-in-law reported that patient is allergic to omeprazole, causing GI bleeding.  ED Course: pt was found to have WBC 6.1, lactic acid 4.4, 2.7, INR 1.2, negative COVID-19 test, FOBT negative, liver function normal, worsening renal function, negative urinalysis, pending lipase, temperature well 1.4, blood pressure 150/50, tachycardia, tachypnea, oxygen saturation 96 to 200% on room air.  Chest x-ray negative.  CT head is negative for acute intracranial abnormalities.  CT abdomen/pelvis is not impressive.  Patient is admitted to stepdown as inpatient.  #  CT-abdomen/pelvis 1. No evidence of acute abnormalities within the solid abdominal organs. 2. Left colonic diverticulosis without evidence of diverticulitis.  3. 1.7 cm focal asymmetry in the slightly lower outer quadrant of the left breast.  4. Calcific atherosclerotic disease of the coronary arteries and aorta.  Review of Systems: Could not reviewed advanced dementia.    Allergy:  Allergies  Allergen Reactions  . Omeprazole     GIB bleeding per his daughter in-law (10/01/18 reported)    Past Medical History:  Diagnosis Date  . Allergic rhinitis, cause unspecified   . DDD (degenerative disc disease), cervical   . Dementia (HCC)    Mild  . GERD (gastroesophageal reflux disease)   . Hypertension     History reviewed. No pertinent surgical history.  Social History:  reports that he has never smoked. He has never used smokeless tobacco. He reports that he does not drink alcohol or use drugs.  Family History:  Family History  Problem Relation Age of Onset  . Diabetes Mellitus II Son      Prior to Admission medications   Medication Sig Start Date End Date Taking? Authorizing Provider  acetaminophen (TYLENOL) 325 MG tablet Take 650 mg by mouth 3 (three) times daily as needed for mild pain or moderate pain.     [provider]  amLODipine (NORVASC) 5 MG tablet Take 5 mg by mouth daily.    [provider]  clotrimazole (LOTRIMIN) 1 % cream Apply 1 application topically 2 (two) times daily. Apply to both feet x 4 weeks    [provider]  divalproex (DEPAKOTE SPRINKLE) 125 MG capsule Take 250 mg by mouth 2 (two) times daily.     [provider]  docusate sodium (COLACE) 100 MG capsule Take 100 mg by mouth daily.    [provider]  donepezil (ARICEPT) 10 MG tablet Take 1 tablet (10 mg total) by mouth daily. Patient not taking: Reported on 04/10/2018 01/22/13   [name], MD  ferrous sulfate 325 (65 FE) MG tablet Take 325 mg by mouth  2 (two) times daily with a meal.    [provider]  LORazepam (ATIVAN) 0.5 MG tablet Take 1 tablet (0.5 mg total) by mouth 2 (two) times daily as needed (for Agitation). 04/12/18   Patrecia Pour, MD  omeprazole (PRILOSEC) 20 MG capsule Take 1 capsule (20 mg total) by mouth daily. Patient taking differently: Take 20 mg by mouth 2 (two) times daily before a meal.  01/22/13   [name], MD  Vitamin D, Ergocalciferol, (DRISDOL) 1.25 MG (50000 UT) CAPS capsule Take 50,000 Units by mouth every 7 (seven) days.    [provider]    Physical Exam: Vitals:   10/01/18 2245 10/01/18 2300 10/01/18 2330 10/02/18 0000  BP: 130/82 (!) 147/77 (!) 146/76 135/75  Pulse:      Resp: 20 (!) 24 (!) 24 (!) 27  Temp:      TempSrc:      SpO2:      Weight:      Height:       General: Not in acute distress HEENT:       Eyes: PERRL, EOMI, no scleral icterus.       ENT: No discharge from the ears and nose       Neck: No JVD, no bruit, no mass felt. Heme: No neck lymph node enlargement. Cardiac: S1/S2, RRR, No murmurs, No gallops or rubs. Respiratory: No rales, wheezing, rhonchi or rubs. GI: Soft, nondistended, seems to have tenderness in central abdomen on palpation. no organomegaly, BS present. GU: No hematuria Ext: No pitting leg edema bilaterally. 2+DP/PT pulse bilaterally. Musculoskeletal: No joint deformities, No joint redness or warmth, no limitation of ROM in spin. Skin: No rashes.  Neuro: Advanced dementia, not oriented X3, not to following command, not cooperate for physical examination.  Cranial nerves II-XII grossly intact, moves all extremities. Neck is supple. Psych: Patient is not psychotic, no suicidal or hemocidal ideation.  Labs on Admission: I have personally reviewed following labs and imaging studies  CBC: Recent Labs  Lab 10/01/18 1435 10/01/18 1826  WBC 6.1  --   NEUTROABS 4.7  --   HGB 11.5* 10.9*  HCT 35.0* 32.0*  MCV 93.8  --   PLT 180  --     Basic Metabolic Panel: Recent Labs  Lab 10/01/18 1435 10/01/18 1826  NA 140 141  K 4.4 3.9  CL 102  --   CO2 23  --   GLUCOSE 124*  --   BUN 28*  --   CREATININE 1.75*  --   CALCIUM 9.8  --    GFR: Estimated Creatinine Clearance: 25.7 mL/min (A) (by C-G formula based on SCr of 1.75 mg/dL (H)). Liver Function Tests: Recent Labs  Lab 10/01/18 1435  AST 39  ALT 27  ALKPHOS 64  BILITOT 0.6  PROT 8.4*  ALBUMIN 3.8   No results for input(s): LIPASE, AMYLASE in the last 168 hours. No results for input(s): AMMONIA in the last 168 hours. Coagulation Profile: Recent Labs  Lab 10/01/18 1435  INR 1.2   Cardiac Enzymes: No results for input(s): CKTOTAL, CKMB, CKMBINDEX, TROPONINI in the last 168 hours. BNP (last 3 results) No results for input(s): PROBNP in the last 8760 hours. HbA1C: No results for input(s): HGBA1C in the last 72 hours. CBG: No results for input(s): GLUCAP in the last 168 hours. Lipid Profile: No results for input(s): CHOL, HDL, LDLCALC, TRIG, CHOLHDL, LDLDIRECT in the last 72 hours. Thyroid Function Tests: No results for input(s): TSH, T4TOTAL, FREET4, T3FREE, THYROIDAB in the last 72 hours. Anemia Panel: No results for input(s): VITAMINB12, FOLATE, FERRITIN, TIBC, IRON, RETICCTPCT in the last 72 hours. Urine analysis:    Component Value Date/Time   COLORURINE YELLOW 10/01/2018 1720   APPEARANCEUR CLEAR 10/01/2018 1720   LABSPEC 1.016 10/01/2018 1720   PHURINE 6.0 10/01/2018 1720   GLUCOSEU NEGATIVE 10/01/2018 1720   GLUCOSEU NEGATIVE 05/19/2009 1345   HGBUR SMALL (A) 10/01/2018 1720   BILIRUBINUR NEGATIVE 10/01/2018 1720   KETONESUR 5 (A) 10/01/2018 1720   PROTEINUR NEGATIVE 10/01/2018 1720   UROBILINOGEN 0.2 07/21/2010 2058   NITRITE NEGATIVE 10/01/2018 1720   LEUKOCYTESUR NEGATIVE 10/01/2018 1720   Sepsis Labs: @LABRCNTIP (procalcitonin:4,lacticidven:4) ) Recent Results (from the past 240 hour(s))  SARS Coronavirus 2 [facility] order,  Performed in [facility] hospital lab) Nasopharyngeal Nasopharyngeal Swab     Status: None   Collection Time: 10/01/18  5:20 PM   Specimen: Nasopharyngeal Swab  Result Value Ref Range Status   SARS Coronavirus 2 NEGATIVE NEGATIVE Final    Comment: (NOTE) If result is NEGATIVE SARS-CoV-2 target nucleic acids are NOT DETECTED. The SARS-CoV-2 RNA is generally detectable in upper and lower  respiratory specimens during the acute phase of infection. The lowest  concentration of SARS-CoV-2 viral copies this assay can detect is 250  copies / mL. A negative result does not preclude SARS-CoV-2 infection  and should not be used as the sole basis for treatment or other  patient management decisions.  A negative result may occur with  improper specimen collection / handling, submission of specimen other  than nasopharyngeal swab, presence of viral mutation(s) within the  areas targeted by this assay, and inadequate number of viral copies  (<250 copies / mL). A negative result must be combined with clinical  observations, patient history, and epidemiological information. If result is POSITIVE SARS-CoV-2 target nucleic acids are DETECTED. The SARS-CoV-2 RNA is generally detectable in upper and lower  respiratory specimens dur ing the acute phase of infection.  Positive  results are indicative of active infection with SARS-CoV-2.  Clinical  correlation with patient history and other diagnostic information is  necessary to determine patient infection status.  Positive results do  not rule out bacterial infection or co-infection with other viruses. If result is PRESUMPTIVE POSTIVE SARS-CoV-2 nucleic acids MAY BE PRESENT.   A presumptive positive result was obtained on the submitted specimen  and confirmed on repeat testing.  While 2019 novel coronavirus  (SARS-CoV-2) nucleic acids may be present in the submitted sample  additional confirmatory testing may be necessary for epidemiological  and / or  clinical management purposes  to differentiate between  SARS-CoV-2 and other Sarbecovirus currently known to infect humans.  If clinically indicated additional testing with an alternate test  methodology [phone]) is advised. The SARS-CoV-2 RNA is generally  detectable in upper and lower respiratory sp ecimens during the acute  phase of infection. The expected result is Negative. Fact Sheet for Patients:  StrictlyIdeas.no Fact Sheet for Healthcare  Providers: BankingDealers.co.za This test is not yet approved or cleared by the [place] and has been authorized for detection and/or diagnosis of SARS-CoV-2 by FDA under an Emergency Use Authorization (EUA).  This EUA will remain in effect (meaning this test can be used) for the duration of the COVID-19 declaration under Section 564(b)(1) of the Act, 21 U.S.C. section 360bbb-3(b)(1), unless the authorization is terminated or revoked sooner. Performed at [facility], [place] [address] [place], [place] [address]      Radiological Exams on Admission: Ct Abdomen Pelvis Wo Contrast  Result Date: 10/01/2018 CLINICAL DATA:  Vomiting and fever. Sepsis. EXAM: CT ABDOMEN AND PELVIS WITHOUT CONTRAST TECHNIQUE: Multidetector CT imaging of the abdomen and pelvis was performed following the standard protocol without IV contrast. COMPARISON:  April 10, 2018 FINDINGS: Lower chest: Calcific atherosclerotic disease of the coronary arteries is. 1.7 cm focal asymmetry in the slightly lower outer quadrant of the left breast. Bilateral mild gynecomastia. Hepatobiliary: No focal liver abnormality is seen. No gallstones, gallbladder wall thickening, or biliary dilatation. Pancreas: Unremarkable. No pancreatic ductal dilatation or surrounding inflammatory changes. Spleen: Normal in size without focal abnormality. Adrenals/Urinary Tract: Adrenal glands are unremarkable. Kidneys are normal, without renal  calculi, focal lesion, or hydronephrosis. Bladder is unremarkable. Stomach/Bowel: Stomach is within normal limits. No evidence of appendicitis. No evidence of bowel wall thickening, distention, or inflammatory changes. Left colonic diverticulosis without evidence of diverticulitis. Vascular/Lymphatic: Aortic atherosclerosis. No enlarged abdominal or pelvic lymph nodes. Reproductive: Prostate is unremarkable. Other: No abdominal wall hernia or abnormality. No abdominopelvic ascites. Musculoskeletal: Sacral spine spondylosis. IMPRESSION: 1. No evidence of acute abnormalities within the solid abdominal organs. 2. Left colonic diverticulosis without evidence of diverticulitis. 3. 1.7 cm focal asymmetry in the slightly lower outer quadrant of the left breast. Correlation with mammography is recommended. 4. Calcific atherosclerotic disease of the coronary arteries and aorta. Electronically Signed   By: Fidela Salisbury M.D.   On: 10/01/2018 21:42   Ct Head Wo Contrast  Result Date: 10/01/2018 CLINICAL DATA:  Confusion EXAM: CT HEAD WITHOUT CONTRAST TECHNIQUE: Contiguous axial images were obtained from the base of the skull through the vertex without intravenous contrast. COMPARISON:  None. FINDINGS: Motion degraded images. Brain: No evidence of acute infarction, hemorrhage, hydrocephalus, extra-axial collection or mass lesion/mass effect. Mild cortical atrophy. Subcortical white matter and periventricular small vessel ischemic changes. Vascular: Intracranial atherosclerosis. Skull: Normal. Negative for fracture or focal lesion. Sinuses/Orbits: Partial opacification of the bilateral ethmoid sinuses. Visualized paranasal sinuses and mastoid air cells are otherwise clear. Other: None. IMPRESSION: Motion degraded images. No evidence of acute intracranial abnormality. Mild atrophy with small vessel ischemic changes. Electronically Signed   By: Julian Hy M.D.   On: 10/01/2018 21:28   Dg Chest Port 1 View   Result Date: 10/01/2018 CLINICAL DATA:  Fever and vomiting. EXAM: PORTABLE CHEST 1 VIEW COMPARISON:  April 10, 2018 FINDINGS: Heart size is stable. Aortic calcifications are again noted. There is no pneumothorax. No large pleural effusion. No focal infiltrate. There is likely atelectasis versus scarring at the lung bases bilaterally. There is some height loss of several mid to lower thoracic vertebral bodies which appears stable from prior studies. IMPRESSION: No active disease. Electronically Signed   By: Constance Holster M.D.   On: 10/01/2018 17:17     EKG: Independently reviewed.  Sinus rhythm, tachycardia, QTC 432, low voltage   Assessment/Plan Principal Problem:   Sepsis ([place]) Active Problems:   GERD   Dementia ([place])  HTN (hypertension)   Acute metabolic encephalopathy   Acute renal failure superimposed on stage 3 chronic kidney disease (HCC)   Nausea & vomiting  Sepsis ([place]): Patient has fever, tachycardia, tachypnea, meets criteria for sepsis.  No leukocytosis.  The source of infection is not clear.  Chest x-ray negative.  COVID-19 negative.  Urinalysis negative.  Patient has nausea and vomiting. Pt seems to have central abdominal tenderness on palpation, indicating likely has intra-abdominal infection but CT abdomen/pelvis is not impressive.  Liver function normal.  Neck is supple, low suspicions for meningitis.  - will admit to SDU as inpt - Started cefepime and Flagyl in ED, will continue now. - PRN Zofran for nausea - Blood cultures x 2 and Ux - will get Procalcitonin and trend lactic acid levels per sepsis protocol. - IVF:  30 cc/kg and folowed by 100 cc/h  Nausea & vomiting: Etiology is not clear.  CT abdomen/pelvis is not impressive.  May be due to viral gastritis.  Since patient is septic, IV antibiotics are started. -Check lipase -Supportive care, IV fluid and PRN Zofran  Acute metabolic encephalopathy: Patient's mental status is worse than his baseline.  Likely  due to sepsis.  CT head is negative.  No focal neurologic findings on physical examination. -Treat underlying issues and frequent neuro check -hold all oral medications until mental status improves  Dementia: -hold donepezil until mental status improves  GERD: -pepcid by IV  HTN (hypertension): -hold amlodipine -IV hydralazine as needed  Iron deficiency anemia: Hemoglobin 10.9 <-- 8.7 on 04/12/18.  FOBT negative. -Hold iron supplement  Acute renal failure superimposed on stage 3 chronic kidney disease ([place]): Baseline Cre is 1.2-1.4, pt's Cre is 1.75 and BUN 28 on admission. Likely due to dehydration. - IVF as above - Follow up renal function by BMP  Abnormal findings of breast: CT scan incidentally showed a 1.7 cm focal asymmetry in the slightly lower outer quadrant of the left breast. : -f/u with PCP for possible mammogram   Inpatient status:  # Patient requires inpatient status due to high intensity of service, high risk for further deterioration and high frequency of surveillance required.  I certify that at the point of admission it is my clinical judgment that the patient will require inpatient hospital care spanning beyond 2 midnights from the point of admission.  . This patient has multiple chronic comorbidities including hypertension, GERD, advanced dementia, anxiety, DDD, iron deficiency anemia . Now patient has presenting with sepsis with unclear etiology, nausea, vomiting, worsening renal function and worsening mental status. . The worrisome physical exam findings include possible abdominal tenderness in central abdominal palpation . The initial radiographic and laboratory data are worrisome because of worsening renal function, elevated lactic acid and sepsis . Current medical needs: please see my assessment and plan . Predictability of an adverse outcome (risk): Patient's multiple comorbidities, now presents with sepsis with unclear etiology, nausea, vomiting, worsening  renal function and worsening mental status.  His presentation is highly complicated.  Need more work-up to find the source of infection.  Patient is at high risk of deteriorating.  Will need to be treated in the hospital for at least 2 days.      DVT ppx:   SQ Lovenox Code Status: Full code per his daughter-in-law and son Family Communication: Yes, patient's daughter-in law by phone  (his son is at the side of her daughter-in when we talked on the phone) Disposition Plan:  Anticipate discharge back to previous SNF Consults called:  none Admission status:  SDU/inpation       Date of Service 10/02/2018    Ivor Costa [facility]   If 7PM-7AM, please contact night-coverage [email or web address] Password [email or web address] 10/02/2018, 12:14 AM

## 2018-10-01 NOTE — ED Notes (Signed)
Unable to obtain o2 sat. Multiple sites andf probes used. Will inform dr. Ralene Bathe.

## 2018-10-01 NOTE — Progress Notes (Signed)
Pharmacy Antibiotic Note  Raymond Hickman is a 85 y.o. male admitted on 10/01/2018 with sepsis/intra-abdominal.  Pharmacy has been consulted for cefepime dosing.  Presenting from [facility] with vomiting x2 days ad fever. Baseline advanced dementia. WBC 6.1, LA 4.8, temp 102.8. Scr 1.75 (CrCl 25 mL/min).   Plan: Start cefepime 2 gram every 24 hours  Monitor renal function, cx results, clinical pic     No data recorded.  No results for input(s): WBC, CREATININE, LATICACIDVEN, VANCOTROUGH, VANCOPEAK, VANCORANDOM, GENTTROUGH, GENTPEAK, GENTRANDOM, TOBRATROUGH, TOBRAPEAK, TOBRARND, AMIKACINPEAK, AMIKACINTROU, AMIKACIN in the last 168 hours.  CrCl cannot be calculated (Patient's most recent lab result is older than the maximum 21 days allowed.).    No Known Allergies  Antimicrobials this admission: Cefepime 8/9 >>  Metronidazole 8/9 >>   Dose adjustments this admission: N/A  Microbiology results: 8/9 BCx: sent 8/9 UCx: sent  8/9 COVID: sent   Thank you for allowing pharmacy to be a part of this patient's care.  Antonietta Jewel, PharmD, [facility] Clinical Pharmacist  Pager: [phone] Phone: [phone] 10/01/2018 4:33 PM

## 2018-10-01 NOTE — ED Provider Notes (Signed)
[place] EMERGENCY DEPARTMENT Provider Note   CSN: [id number] Arrival date & time: 10/01/18  1536    History   Chief Complaint Chief Complaint  Patient presents with   Emesis   Fever    HPI Raymond Hickman is a 85 y.o. male.     The history is provided by the EMS personnel, medical records and a relative. No language interpreter was used.  Emesis Associated symptoms: fever   Fever Associated symptoms: vomiting    Raymond Hickman is a 85 y.o. male who presents to the Emergency Department complaining of fever, vomiting.  Level V caveat due to AMS. Hx is provided by EMS.  Pt presents from [facility] place for evaluation of fever to 100, vomiting x 2 today.  He has a hx/o advanced dementia. At baseline he is ambulatory but confused, unable to follow conversation. Past Medical History:  Diagnosis Date   Allergic rhinitis, cause unspecified    DDD (degenerative disc disease), cervical    Dementia (HCC)    Mild   GERD (gastroesophageal reflux disease)    Hypertension     Patient Active Problem List   Diagnosis Date Noted   Sepsis ([place]) A999333   Acute metabolic encephalopathy [id number]   Acute renal failure superimposed on stage 3 chronic kidney disease ([place]) 10/01/2018   Nausea & vomiting 10/01/2018   Pneumonia 04/10/2018   Skin rash    GERD with stricture 12/04/2010   Atypical lymphocytosis 07/22/2010   GERD 12/23/2009   GENERALIZED OSTEOARTHROSIS UNSPECIFIED SITE 12/23/2009   Dementia ([place]) 09/15/2009   DEGENERATIVE JOINT DISEASE 08/01/2009   WEIGHT LOSS 08/01/2009   NECK PAIN 06/10/2009   CHEST PAIN UNSPECIFIED 06/10/2009   ALLERGIC RHINITIS 03/17/2009   HTN (hypertension) 03/17/2009    History reviewed. No pertinent surgical history.      Home Medications    Prior to Admission medications   Medication Sig Start Date End Date Taking? Authorizing Provider  acetaminophen (TYLENOL) 325 MG tablet Take 650  mg by mouth 3 (three) times daily as needed for mild pain or moderate pain.     [provider]  amLODipine (NORVASC) 5 MG tablet Take 5 mg by mouth daily.    [provider]  clotrimazole (LOTRIMIN) 1 % cream Apply 1 application topically 2 (two) times daily. Apply to both feet x 4 weeks    [provider]  divalproex (DEPAKOTE SPRINKLE) 125 MG capsule Take 250 mg by mouth 2 (two) times daily.     [provider]  docusate sodium (COLACE) 100 MG capsule Take 100 mg by mouth daily.    [provider]  donepezil (ARICEPT) 10 MG tablet Take 1 tablet (10 mg total) by mouth daily. Patient not taking: Reported on 04/10/2018 01/22/13   [name], MD  ferrous sulfate 325 (65 FE) MG tablet Take 325 mg by mouth 2 (two) times daily with a meal.    [provider]  LORazepam (ATIVAN) 0.5 MG tablet Take 1 tablet (0.5 mg total) by mouth 2 (two) times daily as needed (for Agitation). 04/12/18   Patrecia Pour, MD  omeprazole (PRILOSEC) 20 MG capsule Take 1 capsule (20 mg total) by mouth daily. Patient taking differently: Take 20 mg by mouth 2 (two) times daily before a meal.  01/22/13   [name], MD  Vitamin D, Ergocalciferol, (DRISDOL) 1.25 MG (50000 UT) CAPS capsule Take 50,000 Units by mouth every 7 (seven) days.    [provider]    Family History No family history on file.  Social History Social History   Tobacco Use   Smoking status: Never Smoker   Smokeless tobacco: Never Used  Substance Use Topics   Alcohol use: No   Drug use: No     Allergies   Omeprazole   Review of Systems Review of Systems  Constitutional: Positive for fever.  Gastrointestinal: Positive for vomiting.  All other systems reviewed and are negative.    Physical Exam Updated Vital Signs BP 130/82    Pulse (!) 114    Temp (!) 101.4 F (38.6 C) (Rectal)    Resp 20    Ht 5\' 10"  (1.778 m)    Wt 61.2 kg    SpO2 100%    BMI 19.36 kg/m    Physical Exam Vitals signs and nursing note reviewed.  Constitutional:      Appearance: He is well-developed.  HENT:     Head: Normocephalic and atraumatic.  Cardiovascular:     Rate and Rhythm: Regular rhythm. Tachycardia present.  Pulmonary:     Effort: Pulmonary effort is normal. No respiratory distress.  Abdominal:     Palpations: Abdomen is soft.     Tenderness: There is no abdominal tenderness. There is no guarding or rebound.  Musculoskeletal:        General: Swelling present. No tenderness.     Comments: Trace BLE edema.   Skin:    General: Skin is warm and dry.  Neurological:     Mental Status: He is alert.     Comments: Non verbal, uncomfortable appearing. Eyes open spontaneously.  MAE weakly.  Mildly agitated.  Does not follow commands.       ED Treatments / Results  Labs (all labs ordered are listed, but only abnormal results are displayed) Labs Reviewed  COMPREHENSIVE METABOLIC PANEL - Abnormal; Notable for the following components:      Result Value   Glucose, Bld 124 (*)    BUN 28 (*)    Creatinine, Ser 1.75 (*)    Total Protein 8.4 (*)    GFR calc non Af Amer 34 (*)    GFR calc Af Amer 40 (*)    All other components within normal limits  LACTIC ACID, PLASMA - Abnormal; Notable for the following components:   Lactic Acid, Venous 4.8 (*)    All other components within normal limits  LACTIC ACID, PLASMA - Abnormal; Notable for the following components:   Lactic Acid, Venous 2.7 (*)    All other components within normal limits  CBC WITH DIFFERENTIAL/PLATELET - Abnormal; Notable for the following components:   RBC 3.73 (*)    Hemoglobin 11.5 (*)    HCT 35.0 (*)    All other components within normal limits  URINALYSIS, ROUTINE W REFLEX MICROSCOPIC - Abnormal; Notable for the following components:   Hgb urine dipstick SMALL (*)    Ketones, ur 5 (*)    All other components within normal limits  POCT I-STAT 7, (LYTES, BLD GAS, ICA,H+H) - Abnormal;  Notable for the following components:   pH, Arterial 7.454 (*)    pCO2 arterial 28.0 (*)    pO2, Arterial 79.0 (*)    Bicarbonate 19.6 (*)    TCO2 20 (*)    Acid-base deficit 3.0 (*)    HCT 32.0 (*)    Hemoglobin 10.9 (*)    All other components within normal limits  SARS CORONAVIRUS 2 (HOSPITAL ORDER, [place]  LAB)  CULTURE, BLOOD (ROUTINE X 2)  CULTURE, BLOOD (ROUTINE X 2)  URINE CULTURE  PROTIME-INR  POC OCCULT BLOOD, ED  I-STAT ARTERIAL BLOOD GAS, ED    EKG EKG Interpretation  Date/Time:  Sunday October 01 2018 16:36:46 EDT Ventricular Rate:  140 PR Interval:    QRS Duration: 89 QT Interval:  283 QTC Calculation: 432 R Axis:   -95 Text Interpretation:  Sinus tachycardia Atrial premature complex LAD, consider left anterior fascicular block Confirmed by Quintella Reichert [phone]) on 10/01/2018 4:42:52 PM   Radiology Ct Abdomen Pelvis Wo Contrast  Result Date: 10/01/2018 CLINICAL DATA:  Vomiting and fever. Sepsis. EXAM: CT ABDOMEN AND PELVIS WITHOUT CONTRAST TECHNIQUE: Multidetector CT imaging of the abdomen and pelvis was performed following the standard protocol without IV contrast. COMPARISON:  April 10, 2018 FINDINGS: Lower chest: Calcific atherosclerotic disease of the coronary arteries is. 1.7 cm focal asymmetry in the slightly lower outer quadrant of the left breast. Bilateral mild gynecomastia. Hepatobiliary: No focal liver abnormality is seen. No gallstones, gallbladder wall thickening, or biliary dilatation. Pancreas: Unremarkable. No pancreatic ductal dilatation or surrounding inflammatory changes. Spleen: Normal in size without focal abnormality. Adrenals/Urinary Tract: Adrenal glands are unremarkable. Kidneys are normal, without renal calculi, focal lesion, or hydronephrosis. Bladder is unremarkable. Stomach/Bowel: Stomach is within normal limits. No evidence of appendicitis. No evidence of bowel wall thickening, distention, or inflammatory  changes. Left colonic diverticulosis without evidence of diverticulitis. Vascular/Lymphatic: Aortic atherosclerosis. No enlarged abdominal or pelvic lymph nodes. Reproductive: Prostate is unremarkable. Other: No abdominal wall hernia or abnormality. No abdominopelvic ascites. Musculoskeletal: Sacral spine spondylosis. IMPRESSION: 1. No evidence of acute abnormalities within the solid abdominal organs. 2. Left colonic diverticulosis without evidence of diverticulitis. 3. 1.7 cm focal asymmetry in the slightly lower outer quadrant of the left breast. Correlation with mammography is recommended. 4. Calcific atherosclerotic disease of the coronary arteries and aorta. Electronically Signed   By: Fidela Salisbury M.D.   On: 10/01/2018 21:42   Ct Head Wo Contrast  Result Date: 10/01/2018 CLINICAL DATA:  Confusion EXAM: CT HEAD WITHOUT CONTRAST TECHNIQUE: Contiguous axial images were obtained from the base of the skull through the vertex without intravenous contrast. COMPARISON:  None. FINDINGS: Motion degraded images. Brain: No evidence of acute infarction, hemorrhage, hydrocephalus, extra-axial collection or mass lesion/mass effect. Mild cortical atrophy. Subcortical white matter and periventricular small vessel ischemic changes. Vascular: Intracranial atherosclerosis. Skull: Normal. Negative for fracture or focal lesion. Sinuses/Orbits: Partial opacification of the bilateral ethmoid sinuses. Visualized paranasal sinuses and mastoid air cells are otherwise clear. Other: None. IMPRESSION: Motion degraded images. No evidence of acute intracranial abnormality. Mild atrophy with small vessel ischemic changes. Electronically Signed   By: Julian Hy M.D.   On: 10/01/2018 21:28   Dg Chest Port 1 View  Result Date: 10/01/2018 CLINICAL DATA:  Fever and vomiting. EXAM: PORTABLE CHEST 1 VIEW COMPARISON:  April 10, 2018 FINDINGS: Heart size is stable. Aortic calcifications are again noted. There is no pneumothorax.  No large pleural effusion. No focal infiltrate. There is likely atelectasis versus scarring at the lung bases bilaterally. There is some height loss of several mid to lower thoracic vertebral bodies which appears stable from prior studies. IMPRESSION: No active disease. Electronically Signed   By: Constance Holster M.D.   On: 10/01/2018 17:17    Procedures Procedures (including critical care time) CRITICAL CARE Performed by: Quintella Reichert   Total critical care time: 35 minutes  Critical care time was exclusive of separately billable  procedures and treating other patients.  Critical care was necessary to treat or prevent imminent or life-threatening deterioration.  Critical care was time spent personally by me on the following activities: development of treatment plan with patient and/or surrogate as well as nursing, discussions with consultants, evaluation of patient's response to treatment, examination of patient, obtaining history from patient or surrogate, ordering and performing treatments and interventions, ordering and review of laboratory studies, ordering and review of radiographic studies, pulse oximetry and re-evaluation of patient's condition.  Medications Ordered in ED Medications  ceFEPIme (MAXIPIME) 2 g in sodium chloride 0.9 % 100 mL IVPB (has no administration in time range)  ondansetron (ZOFRAN) injection 4 mg (has no administration in time range)  famotidine (PEPCID) IVPB 20 mg premix (has no administration in time range)  ondansetron (ZOFRAN) injection 4 mg (4 mg Intravenous Given 10/01/18 1715)  acetaminophen (TYLENOL) suppository 650 mg (650 mg Rectal Given 10/01/18 1630)  ceFEPIme (MAXIPIME) 2 g in sodium chloride 0.9 % 100 mL IVPB (0 g Intravenous Stopped 10/01/18 1807)  metroNIDAZOLE (FLAGYL) IVPB 500 mg (0 mg Intravenous Stopped 10/01/18 1910)  sodium chloride 0.9 % bolus 1,000 mL (0 mLs Intravenous Stopped 10/01/18 2137)  sodium chloride 0.9 % bolus 1,836 mL (0 mL/kg   61.2 kg Intravenous Stopped 10/01/18 2137)     Initial Impression / Assessment and Plan / ED Course  I have reviewed the triage vital signs and the nursing notes.  Pertinent labs & imaging results that were available during my care of the patient were reviewed by me and considered in my medical decision making (see chart for details).        Patient here for evaluation of fever, vomiting. He is ill appearing and agitated on initial assessment with vomiting. No meningismus. He was treated with IV fluids, antibiotics for presumed sepsis. Labs are significant for lactic acidosis. Source of infection not evident on imaging, labs. Hospitalist consulted for admission for further evaluation and treatment. Final Clinical Impressions(s) / ED Diagnoses   Final diagnoses:  Sepsis with acute renal failure, due to unspecified organism, unspecified acute renal failure type, unspecified whether septic shock present [facility])    ED Discharge Orders    None       Quintella Reichert, MD 10/02/18 0010

## 2018-10-02 LAB — BASIC METABOLIC PANEL
Anion gap: 12 (ref 5–15)
BUN: 20 mg/dL (ref 8–23)
CO2: 22 mmol/L (ref 22–32)
Calcium: 8.8 mg/dL — ABNORMAL LOW (ref 8.9–10.3)
Chloride: 105 mmol/L (ref 98–111)
Creatinine, Ser: 1.6 mg/dL — ABNORMAL HIGH (ref 0.61–1.24)
GFR calc Af Amer: 44 mL/min — ABNORMAL LOW (ref >60)
GFR calc non Af Amer: 38 mL/min — ABNORMAL LOW (ref >60)
Glucose, Bld: 154 mg/dL — ABNORMAL HIGH (ref 70–99)
Potassium: 4.6 mmol/L (ref 3.5–5.1)
Sodium: 139 mmol/L (ref 135–145)

## 2018-10-02 LAB — CBC
HCT: 31.9 % — ABNORMAL LOW (ref 39.0–52.0)
Hemoglobin: 10.6 g/dL — ABNORMAL LOW (ref 13.0–17.0)
MCH: 30.8 pg (ref 26.0–34.0)
MCHC: 33.2 g/dL (ref 30.0–36.0)
MCV: 92.7 fL (ref 80.0–100.0)
Platelets: 143 10*3/uL — ABNORMAL LOW (ref 150–400)
RBC: 3.44 MIL/uL — ABNORMAL LOW (ref 4.22–5.81)
RDW: 13.6 % (ref 11.5–15.5)
WBC: 9.7 10*3/uL (ref 4.0–10.5)
nRBC: 0 % (ref 0.0–0.2)

## 2018-10-02 LAB — URINE CULTURE: Culture: NO GROWTH

## 2018-10-02 LAB — LIPASE, BLOOD: Lipase: 16 U/L (ref 11–51)

## 2018-10-02 LAB — LACTIC ACID, PLASMA
Lactic Acid, Venous: 2.4 mmol/L (ref 0.5–1.9)
Lactic Acid, Venous: 2.1 mmol/L (ref 0.5–1.9)

## 2018-10-02 MED ORDER — METRONIDAZOLE IN NACL 5-0.79 MG/ML-% IV SOLN
500.0000 mg | Freq: Three times a day (TID) | INTRAVENOUS | Status: DC
  Administered 2018-10-02 – 2018-10-04 (×8): 500 mg via INTRAVENOUS
  Filled 2018-10-02 (×8): qty 100

## 2018-10-02 MED ORDER — FAMOTIDINE IN NACL 20-0.9 MG/50ML-% IV SOLN
20.0000 mg | INTRAVENOUS | Status: DC
  Administered 2018-10-03 – 2018-10-04 (×2): 20 mg via INTRAVENOUS
  Filled 2018-10-02 (×3): qty 50

## 2018-10-02 MED ORDER — ACETAMINOPHEN 650 MG RE SUPP: 650.0000 mg | Freq: Four times a day (QID) | RECTAL | Status: DC | PRN

## 2018-10-02 MED ORDER — SODIUM CHLORIDE 0.9 % IV SOLN
INTRAVENOUS | Status: DC
  Administered 2018-10-02 – 2018-10-04 (×5): via INTRAVENOUS

## 2018-10-02 MED ORDER — ENOXAPARIN SODIUM 30 MG/0.3ML ~~LOC~~ SOLN
30.0000 mg | SUBCUTANEOUS | Status: DC
  Filled 2018-10-02: qty 0.3

## 2018-10-02 MED ORDER — HYDRALAZINE HCL 20 MG/ML IJ SOLN: 5.0000 mg | INTRAMUSCULAR | Status: DC | PRN

## 2018-10-02 MED ORDER — ACETAMINOPHEN 325 MG PO TABS: 650.0000 mg | ORAL_TABLET | Freq: Four times a day (QID) | ORAL | Status: DC | PRN

## 2018-10-02 NOTE — Progress Notes (Signed)
Pharmacy note: renal adjustment  Raymond Hickman is on famotidine (not on PTA) -SCr= 1.6, CrCl ~ 30  Plan -Will adjust famotidine to 20mg  IV q24h -Please call pharmacy with any questions  Hildred Laser, PharmD Clinical Pharmacist **Pharmacist phone directory can now be found on [email or web address] ([email or web address]).  Listed under [place].

## 2018-10-02 NOTE — Progress Notes (Signed)
PROGRESS NOTE    ANES FROSS  [id number] DOB: [birth year] DOA: 10/01/2018 PCP: Dixie Dials, MD    Brief Narrative:  85 year old male from nursing home with medical history of hypertension, GERD, advanced dementia, anxiety, iron deficiency anemia and possible dysphagia who was brought from nursing home to the emergency room with patient complaining of abdominal pain nausea and low-grade temperature.  In the emergency room, WBC count was normal.  Lactic acid was 4.4, INR 1.2, COVID-19 negative, FOBT negative, liver function test normal, acute renal failure present, urinalysis normal.  Blood pressures stable with tachycardia.  On room air.  Chest x-ray normal.  CT scan of the head no acute abnormalities.  CT abdomen pelvis not showing any acute findings.  He was treated as sepsis with IV fluid boluses, stabilized.   Assessment & Plan:   Principal Problem:   Sepsis ([place]) Active Problems:   GERD   Dementia ([place])   HTN (hypertension)   Acute metabolic encephalopathy   Acute renal failure superimposed on stage 3 chronic kidney disease (HCC)   Nausea & vomiting  Sepsis present on admission: With endorgan dysfunction with lactic acidosis and acute renal failure. Primary source unknown.  Chest x-ray with no evidence of infection, he has history of aspiration pneumonia.  CT abdomen pelvis did not show any obvious evidence or source of infection.  Urine was clear.  Will treat as sepsis with unknown source, possible viral syndrome, possible aspiration pneumonia. Continue cefepime and Flagyl. Patient does not have any evidence of neck rigidity, he is not looking toxic and fever is defervescing, do not suspect meningitis. Seen by speech, started on pured diet. Lactic acid is normalizing.  Acute metabolic encephalopathy: Family reported decreased mentation and more confusion.  Probably due to above.  CT head was normal.  No focal deficit.  We will continue to close monitoring.  Nausea and  vomiting: Etiology unclear.  Will monitor.  Symptoms improving.  Continue IV fluid support.  Will allow pured diet today.  GERD: On PPI.  Hypertension: On amlodipine at home.  Holding off due to risk of hypotension.  Will monitor.  Acute renal failure superimposed on stage III chronic kidney disease: Baseline creatinine 1.4.  Treated with IV fluids and already improving.  DVT prophylaxis: Lovenox subcu, will hold today if he needs any lumbar puncture.  SCDs. Code Status: Full code Family Communication: Called and updated patient's son . Disposition Plan: Progressive.  SNF on discharge.   Consultants:   None  Procedures:   None  Antimicrobials:   Cefepime, 10/01/2018  Flagyl, 10/02/2018   Subjective: Patient seen and examined.  No overnight events.  He is mostly nonverbal.  He was just mumbling.  No nausea vomiting.  Objective: Vitals:   10/02/18 0700 10/02/18 0736 10/02/18 0900 10/02/18 1136  BP:  (!) 146/79  107/70  Pulse: 80 85 73 78  Resp: 18 20 19  (!) 21  Temp:  97.8 F (36.6 C)  97.6 F (36.4 C)  TempSrc:  Oral  Oral  SpO2: 100% 100% 100% 100%  Weight:      Height:        Intake/Output Summary (Last 24 hours) at 10/02/2018 1324 Last data filed at 10/02/2018 0800 Gross per 24 hour  Intake 2836 ml  Output 2600 ml  Net 236 ml   Filed Weights   10/01/18 1722 10/02/18 0439  Weight: 61.2 kg 58.9 kg    Examination:  General exam: Appears calm and comfortable, chronically sick looking, not in  any distress. Respiratory system: Clear to auscultation. Respiratory effort normal.  No added sounds. Cardiovascular system: S1 & S2 heard, RRR. No JVD, murmurs, rubs, gallops or clicks. No pedal edema. Gastrointestinal system: Abdomen is nondistended, soft and nontender. No organomegaly or masses felt. Normal bowel sounds heard. Central nervous system: Patient is sleepy and lethargic.  Awakens to stimuli, no meaningful interaction. Extremities: Symmetric 5 x 5 power.  Skin: No rashes, lesions or ulcers Psychiatry: Judgement and insight appear impaired.  Flat affect.    Data Reviewed: I have personally reviewed following labs and imaging studies  CBC: Recent Labs  Lab 10/01/18 1435 10/01/18 1826 10/02/18 0241  WBC 6.1  --  9.7  NEUTROABS 4.7  --   --   HGB 11.5* 10.9* 10.6*  HCT 35.0* 32.0* 31.9*  MCV 93.8  --  92.7  PLT 180  --  [id number]*   Basic Metabolic Panel: Recent Labs  Lab 10/01/18 1435 10/01/18 1826 10/02/18 0241  NA 140 141 139  K 4.4 3.9 4.6  CL 102  --  105  CO2 23  --  22  GLUCOSE 124*  --  154*  BUN 28*  --  20  CREATININE 1.75*  --  1.60*  CALCIUM 9.8  --  8.8*   GFR: Estimated Creatinine Clearance: 27.1 mL/min (A) (by C-G formula based on SCr of 1.6 mg/dL (H)). Liver Function Tests: Recent Labs  Lab 10/01/18 1435  AST 39  ALT 27  ALKPHOS 64  BILITOT 0.6  PROT 8.4*  ALBUMIN 3.8   Recent Labs  Lab 10/02/18 0241  LIPASE 16   No results for input(s): AMMONIA in the last 168 hours. Coagulation Profile: Recent Labs  Lab 10/01/18 1435  INR 1.2   Cardiac Enzymes: No results for input(s): CKTOTAL, CKMB, CKMBINDEX, TROPONINI in the last 168 hours. BNP (last 3 results) No results for input(s): PROBNP in the last 8760 hours. HbA1C: No results for input(s): HGBA1C in the last 72 hours. CBG: No results for input(s): GLUCAP in the last 168 hours. Lipid Profile: No results for input(s): CHOL, HDL, LDLCALC, TRIG, CHOLHDL, LDLDIRECT in the last 72 hours. Thyroid Function Tests: No results for input(s): TSH, T4TOTAL, FREET4, T3FREE, THYROIDAB in the last 72 hours. Anemia Panel: No results for input(s): VITAMINB12, FOLATE, FERRITIN, TIBC, IRON, RETICCTPCT in the last 72 hours. Sepsis Labs: Recent Labs  Lab 10/01/18 1435 10/01/18 2149 10/02/18 0241 10/02/18 0836  LATICACIDVEN 4.8* 2.7* 2.4* 2.1*    Recent Results (from the past 240 hour(s))  Culture, blood (Routine x 2)     Status: None (Preliminary  result)   Collection Time: 10/01/18  2:10 PM   Specimen: BLOOD LEFT WRIST  Result Value Ref Range Status   Specimen Description BLOOD LEFT WRIST  Final   Special Requests   Final    BOTTLES DRAWN AEROBIC ONLY Blood Culture results may not be optimal due to an inadequate volume of blood received in culture bottles   Culture   Final    NO GROWTH < 24 HOURS Performed at [facility], [place] [address], [place], [place] [address]    Report Status PENDING  Incomplete  Culture, blood (Routine x 2)     Status: None (Preliminary result)   Collection Time: 10/01/18  2:35 PM   Specimen: BLOOD  Result Value Ref Range Status   Specimen Description BLOOD RIGHT ANTECUBITAL  Final   Special Requests   Final    BOTTLES DRAWN AEROBIC AND ANAEROBIC Blood  Culture adequate volume   Culture   Final    NO GROWTH < 24 HOURS Performed at [facility], [place] [address], [place], [place] [address]    Report Status PENDING  Incomplete  Urine culture     Status: None   Collection Time: 10/01/18  4:24 PM   Specimen: Urine, Random  Result Value Ref Range Status   Specimen Description URINE, RANDOM  Final   Special Requests NONE  Final   Culture   Final    NO GROWTH Performed at [facility], [place] [address] [place], [place] [address]    Report Status 10/02/2018 FINAL  Final  SARS Coronavirus 2 [facility] order, Performed in [facility] hospital lab) Nasopharyngeal Nasopharyngeal Swab     Status: None   Collection Time: 10/01/18  5:20 PM   Specimen: Nasopharyngeal Swab  Result Value Ref Range Status   SARS Coronavirus 2 NEGATIVE NEGATIVE Final    Comment: (NOTE) If result is NEGATIVE SARS-CoV-2 target nucleic acids are NOT DETECTED. The SARS-CoV-2 RNA is generally detectable in upper and lower  respiratory specimens during the acute phase of infection. The lowest  concentration of SARS-CoV-2 viral copies this assay can detect is 250  copies / mL. A negative result does not preclude  SARS-CoV-2 infection  and should not be used as the sole basis for treatment or other  patient management decisions.  A negative result may occur with  improper specimen collection / handling, submission of specimen other  than nasopharyngeal swab, presence of viral mutation(s) within the  areas targeted by this assay, and inadequate number of viral copies  (<250 copies / mL). A negative result must be combined with clinical  observations, patient history, and epidemiological information. If result is POSITIVE SARS-CoV-2 target nucleic acids are DETECTED. The SARS-CoV-2 RNA is generally detectable in upper and lower  respiratory specimens dur ing the acute phase of infection.  Positive  results are indicative of active infection with SARS-CoV-2.  Clinical  correlation with patient history and other diagnostic information is  necessary to determine patient infection status.  Positive results do  not rule out bacterial infection or co-infection with other viruses. If result is PRESUMPTIVE POSTIVE SARS-CoV-2 nucleic acids MAY BE PRESENT.   A presumptive positive result was obtained on the submitted specimen  and confirmed on repeat testing.  While 2019 novel coronavirus  (SARS-CoV-2) nucleic acids may be present in the submitted sample  additional confirmatory testing may be necessary for epidemiological  and / or clinical management purposes  to differentiate between  SARS-CoV-2 and other Sarbecovirus currently known to infect humans.  If clinically indicated additional testing with an alternate test  methodology [phone]) is advised. The SARS-CoV-2 RNA is generally  detectable in upper and lower respiratory sp ecimens during the acute  phase of infection. The expected result is Negative. Fact Sheet for Patients:  StrictlyIdeas.no Fact Sheet for Healthcare Providers: BankingDealers.co.za This test is not yet approved or cleared by the  [place] FDA and has been authorized for detection and/or diagnosis of SARS-CoV-2 by FDA under an Emergency Use Authorization (EUA).  This EUA will remain in effect (meaning this test can be used) for the duration of the COVID-19 declaration under Section 564(b)(1) of the Act, 21 U.S.C. section 360bbb-3(b)(1), unless the authorization is terminated or revoked sooner. Performed at [facility], [place] [address], [place], [place] [address]          Radiology Studies: Ct Abdomen  Pelvis Wo Contrast  Result Date: 10/01/2018 CLINICAL DATA:  Vomiting and fever. Sepsis. EXAM: CT ABDOMEN AND PELVIS WITHOUT CONTRAST TECHNIQUE: Multidetector CT imaging of the abdomen and pelvis was performed following the standard protocol without IV contrast. COMPARISON:  April 10, 2018 FINDINGS: Lower chest: Calcific atherosclerotic disease of the coronary arteries is. 1.7 cm focal asymmetry in the slightly lower outer quadrant of the left breast. Bilateral mild gynecomastia. Hepatobiliary: No focal liver abnormality is seen. No gallstones, gallbladder wall thickening, or biliary dilatation. Pancreas: Unremarkable. No pancreatic ductal dilatation or surrounding inflammatory changes. Spleen: Normal in size without focal abnormality. Adrenals/Urinary Tract: Adrenal glands are unremarkable. Kidneys are normal, without renal calculi, focal lesion, or hydronephrosis. Bladder is unremarkable. Stomach/Bowel: Stomach is within normal limits. No evidence of appendicitis. No evidence of bowel wall thickening, distention, or inflammatory changes. Left colonic diverticulosis without evidence of diverticulitis. Vascular/Lymphatic: Aortic atherosclerosis. No enlarged abdominal or pelvic lymph nodes. Reproductive: Prostate is unremarkable. Other: No abdominal wall hernia or abnormality. No abdominopelvic ascites. Musculoskeletal: Sacral spine spondylosis. IMPRESSION: 1. No evidence of acute abnormalities within the solid  abdominal organs. 2. Left colonic diverticulosis without evidence of diverticulitis. 3. 1.7 cm focal asymmetry in the slightly lower outer quadrant of the left breast. Correlation with mammography is recommended. 4. Calcific atherosclerotic disease of the coronary arteries and aorta. Electronically Signed   By: Fidela Salisbury M.D.   On: 10/01/2018 21:42   Ct Head Wo Contrast  Result Date: 10/01/2018 CLINICAL DATA:  Confusion EXAM: CT HEAD WITHOUT CONTRAST TECHNIQUE: Contiguous axial images were obtained from the base of the skull through the vertex without intravenous contrast. COMPARISON:  None. FINDINGS: Motion degraded images. Brain: No evidence of acute infarction, hemorrhage, hydrocephalus, extra-axial collection or mass lesion/mass effect. Mild cortical atrophy. Subcortical white matter and periventricular small vessel ischemic changes. Vascular: Intracranial atherosclerosis. Skull: Normal. Negative for fracture or focal lesion. Sinuses/Orbits: Partial opacification of the bilateral ethmoid sinuses. Visualized paranasal sinuses and mastoid air cells are otherwise clear. Other: None. IMPRESSION: Motion degraded images. No evidence of acute intracranial abnormality. Mild atrophy with small vessel ischemic changes. Electronically Signed   By: Julian Hy M.D.   On: 10/01/2018 21:28   Dg Chest Port 1 View  Result Date: 10/01/2018 CLINICAL DATA:  Fever and vomiting. EXAM: PORTABLE CHEST 1 VIEW COMPARISON:  April 10, 2018 FINDINGS: Heart size is stable. Aortic calcifications are again noted. There is no pneumothorax. No large pleural effusion. No focal infiltrate. There is likely atelectasis versus scarring at the lung bases bilaterally. There is some height loss of several mid to lower thoracic vertebral bodies which appears stable from prior studies. IMPRESSION: No active disease. Electronically Signed   By: Constance Holster M.D.   On: 10/01/2018 17:17        Scheduled Meds:  Continuous Infusions: . sodium chloride 100 mL/hr at 10/02/18 0501  . ceFEPime (MAXIPIME) IV    . [START ON 10/03/2018] famotidine (PEPCID) IV    . metronidazole 500 mg (10/02/18 1132)     LOS: 1 day    Time spent: 35 minutes.    Barb Merino, MD [facility] Pager [phone]  If 7PM-7AM, please contact night-coverage [email or web address] Password [email or web address] 10/02/2018, 1:24 PM

## 2018-10-02 NOTE — Evaluation (Signed)
Clinical/Bedside Swallow Evaluation Patient Details  Name: Raymond Hickman MRN: [id number] Date of Birth: 18-May-1929  Today's Date: 10/02/2018 Time: SLP Start Time (ACUTE ONLY): 0931 SLP Stop Time (ACUTE ONLY): 0945 SLP Time Calculation (min) (ACUTE ONLY): 14 min  Past Medical History:  Past Medical History:  Diagnosis Date  . Allergic rhinitis, cause unspecified   . DDD (degenerative disc disease), cervical   . Dementia (HCC)    Mild  . GERD (gastroesophageal reflux disease)   . Hypertension    Past Surgical History: History reviewed. No pertinent surgical history. HPI:  Raymond Hickman is a 85 y.o. male with medical history significant of hypertension, GERD, advanced dementia, anxiety, DDD, iron deficiency anemia, who presents with nausea, vomiting and fever.  Pt admitted for sepsis. CXR 8/9 revealed no active disease.  Head CT 8/9 with no acute findings.   Assessment / Plan / Recommendation Clinical Impression  Pt presents with mild oral dysphagia which may be related to level of arousal.  Pt tolerated small cup sips of thin liquid, but was unable to siphon from straw with repeated attempts and verbal and tactile cuing. With regular and soft solids pt was unable to transit solids and minimal to no mastication was observed.  Solids were partially expectorated and SLP removed from oral cavity.  With simulated ground consistency mastication was observed and bolus was not expectorated. Prolonged oral phase noted.  SLP was unable to assess oral residuals.  Pt tolerated puree with seemingly adequate oral clearance.  There were no clinical indicators of pharyngeal dysphagia with any consistencies trialed.  Recommend puree diet with thin liquid by cup sip. SLP Visit Diagnosis: Dysphagia, oral phase (R13.11)    Aspiration Risk  Mild aspiration risk    Diet Recommendation Dysphagia 1 (Puree);Thin liquid   Liquid Administration via: No straw;Cup Medication Administration: Crushed with  puree Supervision: Full supervision/cueing for compensatory strategies Compensations: Minimize environmental distractions;Slow rate;Small sips/bites Postural Changes: Seated upright at 90 degrees;Remain upright for at least 30 minutes after po intake    Other  Recommendations Oral Care Recommendations: Oral care BID   Follow up Recommendations        Frequency and Duration min 2x/week  2 weeks       Prognosis Prognosis for Safe Diet Advancement: Fair Barriers to Reach Goals: Cognitive deficits      Swallow Study   General HPI: Raymond Hickman is a 85 y.o. male with medical history significant of hypertension, GERD, advanced dementia, anxiety, DDD, iron deficiency anemia, who presents with nausea, vomiting and fever.  Pt admitted for sepsis. CXR 8/9 revealed no active disease.  Head CT 8/9 with no acute findings. Type of Study: Bedside Swallow Evaluation Diet Prior to this Study: NPO Temperature Spikes Noted: No History of Recent Intubation: No Behavior/Cognition: Lethargic/Drowsy;Doesn't follow directions Oral Cavity Assessment: Within Functional Limits Oral Care Completed by SLP: No Oral Cavity - Dentition: Adequate natural dentition Self-Feeding Abilities: Total assist Patient Positioning: Upright in bed Baseline Vocal Quality: Not observed Volitional Cough: Cognitively unable to elicit Volitional Swallow: Unable to elicit    Oral/Motor/Sensory Function Overall Oral Motor/Sensory Function: (Unable to assess)   Ice Chips Ice chips: Not tested   Thin Liquid Thin Liquid: Impaired Presentation: Straw;Cup Oral Phase Functional Implications: (unable to siphon from straw)    Nectar Thick Nectar Thick Liquid: Not tested   Honey Thick Honey Thick Liquid: Not tested   Puree Puree: Within functional limits Presentation: Spoon   Solid     Solid:  Impaired Oral Phase Impairments: Poor awareness of bolus;Impaired mastication Oral Phase Functional Implications: Prolonged oral  transit;Impaired mastication(Expectoration of solids)      Celedonio Savage, MA, [place] Office: [phone]; Pager (8/10): [phone] 10/02/2018,10:56 AM

## 2018-10-02 NOTE — ED Notes (Signed)
ED TO INPATIENT HANDOFF REPORT  ED Nurse Name and Phone #:   S Name/Age/Gender Raymond Hickman 85 y.o. male Room/Bed: 031C/031C  Code Status   Code Status: Full Code  Home/SNF/Other Skilled nursing facility Not oriented. Dementia. Unable to follow commands Is this baseline? Yes   Triage Complete: Triage complete  Chief Complaint fever, vomiting  Triage Note Pt arrives EMS from [facility] with c/o vomiting x 2 and fever today. Pt is at baseline per EMS, Minimal verbal and advanced dementia.   Allergies Allergies  Allergen Reactions  . Omeprazole     GIB bleeding per his daughter in-law (10/01/18 reported)    Level of Care/Admitting Diagnosis ED Disposition    ED Disposition Condition [facility] Area: [place] [[id number]]  Level of Care: Progressive [102]  Covid Evaluation: Confirmed COVID Negative  [facility]: [id number]  Admitting Physician: Ivor Costa [[id number]]  Attending Physician: Ivor Costa [phone]  Estimated length of stay: past midnight tomorrow  Certification:: I certify this patient will need inpatient services for at least 2 midnights  PT Class (Do Not Modify): Inpatient [101]  PT Acc Code (Do Not Modify): Private [1]       B Medical/Surgery History Past Medical History:  Diagnosis Date  . Allergic rhinitis, cause unspecified   . DDD (degenerative disc disease), cervical   . Dementia (HCC)    Mild  . GERD (gastroesophageal reflux disease)   . Hypertension    History reviewed. No pertinent surgical history.   A IV Location/Drains/Wounds Patient Lines/Drains/Airways Status   Active Line/Drains/Airways    Name:   Placement date:   Placement time:   Site:   Days:   Peripheral IV 10/01/18 Right Antecubital   10/01/18    1714    Antecubital   1   Midline Single Lumen 04/11/18 Midline Right Basilic 8 cm 0 cm   [id number]    [id number]    Basilic   [id number]   External Urinary Catheter   10/01/18    1650    -   1           Intake/Output Last 24 hours  Intake/Output Summary (Last 24 hours) at 10/02/2018 0355 Last data filed at 10/01/2018 2139 Gross per 24 hour  Intake 2836 ml  Output -  Net 2836 ml    Labs/Imaging Results for orders placed or performed during the hospital encounter of 10/01/18 (from the past 48 hour(s))  Comprehensive metabolic panel     Status: Abnormal   Collection Time: 10/01/18  2:35 PM  Result Value Ref Range   Sodium 140 135 - 145 mmol/L   Potassium 4.4 3.5 - 5.1 mmol/L   Chloride 102 98 - 111 mmol/L   CO2 23 22 - 32 mmol/L   Glucose, Bld 124 (H) 70 - 99 mg/dL   BUN 28 (H) 8 - 23 mg/dL   Creatinine, Ser 1.75 (H) 0.61 - 1.24 mg/dL   Calcium 9.8 8.9 - 10.3 mg/dL   Total Protein 8.4 (H) 6.5 - 8.1 g/dL   Albumin 3.8 3.5 - 5.0 g/dL   AST 39 15 - 41 U/L   ALT 27 0 - 44 U/L   Alkaline Phosphatase 64 38 - 126 U/L   Total Bilirubin 0.6 0.3 - 1.2 mg/dL   GFR calc non Af Amer 34 (L) >60 mL/min   GFR calc Af Amer 40 (L) >60 mL/min   Anion gap 15 5 - 15  Comment: Performed at [facility], [place] [address], [place], [place] [address]  Lactic acid, plasma     Status: Abnormal   Collection Time: 10/01/18  2:35 PM  Result Value Ref Range   Lactic Acid, Venous 4.8 (HH) 0.5 - 1.9 mmol/L    Comment: CRITICAL RESULT CALLED TO, READ BACK BY AND VERIFIED WITH: Thurmond Butts AT [id number] 10/01/2018 BY L BENFIELD Performed at [facility], [place] [address], [place], [place] [address]   CBC with Differential     Status: Abnormal   Collection Time: 10/01/18  2:35 PM  Result Value Ref Range   WBC 6.1 4.0 - 10.5 K/uL   RBC 3.73 (L) 4.22 - 5.81 MIL/uL   Hemoglobin 11.5 (L) 13.0 - 17.0 g/dL   HCT 35.0 (L) 39.0 - 52.0 %   MCV 93.8 80.0 - 100.0 fL   MCH 30.8 26.0 - 34.0 pg   MCHC 32.9 30.0 - 36.0 g/dL   RDW 13.6 11.5 - 15.5 %   Platelets 180 150 - 400 K/uL   nRBC 0.0 0.0 - 0.2 %   Neutrophils Relative % 78 %   Neutro Abs 4.7 1.7 - 7.7 K/uL   Lymphocytes Relative 16 %   Lymphs Abs  1.0 0.7 - 4.0 K/uL   Monocytes Relative 6 %   Monocytes Absolute 0.4 0.1 - 1.0 K/uL   Eosinophils Relative 0 %   Eosinophils Absolute 0.0 0.0 - 0.5 K/uL   Basophils Relative 0 %   Basophils Absolute 0.0 0.0 - 0.1 K/uL   Immature Granulocytes 0 %   Abs Immature Granulocytes 0.02 0.00 - 0.07 K/uL    Comment: Performed at [facility] [address], [place], [place] [address]  Protime-INR     Status: None   Collection Time: 10/01/18  2:35 PM  Result Value Ref Range   Prothrombin Time 14.9 11.4 - 15.2 seconds   INR 1.2 0.8 - 1.2    Comment: (NOTE) INR goal varies based on device and disease states. Performed at [facility], [place] [address], [place], [place] [address]   Urinalysis, Routine w reflex microscopic     Status: Abnormal   Collection Time: 10/01/18  5:20 PM  Result Value Ref Range   Color, Urine YELLOW YELLOW   APPearance CLEAR CLEAR   Specific Gravity, Urine 1.016 1.005 - 1.030   pH 6.0 5.0 - 8.0   Glucose, UA NEGATIVE NEGATIVE mg/dL   Hgb urine dipstick SMALL (A) NEGATIVE   Bilirubin Urine NEGATIVE NEGATIVE   Ketones, ur 5 (A) NEGATIVE mg/dL   Protein, ur NEGATIVE NEGATIVE mg/dL   Nitrite NEGATIVE NEGATIVE   Leukocytes,Ua NEGATIVE NEGATIVE   RBC / HPF 21-50 0 - 5 RBC/hpf   WBC, UA 0-5 0 - 5 WBC/hpf   Bacteria, UA NONE SEEN NONE SEEN   Squamous Epithelial / LPF 0-5 0 - 5    Comment: Performed at [facility], [place] [address], [place], [place] [address]  SARS Coronavirus 2 [facility] order, Performed in [facility] hospital lab) Nasopharyngeal Nasopharyngeal Swab     Status: None   Collection Time: 10/01/18  5:20 PM   Specimen: Nasopharyngeal Swab  Result Value Ref Range   SARS Coronavirus 2 NEGATIVE NEGATIVE    Comment: (NOTE) If result is NEGATIVE SARS-CoV-2 target nucleic acids are NOT DETECTED. The SARS-CoV-2 RNA is generally detectable in upper and lower  respiratory specimens during the acute phase of infection. The lowest  concentration  of SARS-CoV-2 viral copies this assay can detect is 250  copies / mL. A negative result does not preclude SARS-CoV-2 infection  and should not be used as the sole basis for treatment or other  patient management decisions.  A negative result may occur with  improper specimen collection / handling, submission of specimen other  than nasopharyngeal swab, presence of viral mutation(s) within the  areas targeted by this assay, and inadequate number of viral copies  (<250 copies / mL). A negative result must be combined with clinical  observations, patient history, and epidemiological information. If result is POSITIVE SARS-CoV-2 target nucleic acids are DETECTED. The SARS-CoV-2 RNA is generally detectable in upper and lower  respiratory specimens dur ing the acute phase of infection.  Positive  results are indicative of active infection with SARS-CoV-2.  Clinical  correlation with patient history and other diagnostic information is  necessary to determine patient infection status.  Positive results do  not rule out bacterial infection or co-infection with other viruses. If result is PRESUMPTIVE POSTIVE SARS-CoV-2 nucleic acids MAY BE PRESENT.   A presumptive positive result was obtained on the submitted specimen  and confirmed on repeat testing.  While 2019 novel coronavirus  (SARS-CoV-2) nucleic acids may be present in the submitted sample  additional confirmatory testing may be necessary for epidemiological  and / or clinical management purposes  to differentiate between  SARS-CoV-2 and other Sarbecovirus currently known to infect humans.  If clinically indicated additional testing with an alternate test  methodology [phone]) is advised. The SARS-CoV-2 RNA is generally  detectable in upper and lower respiratory sp ecimens during the acute  phase of infection. The expected result is Negative. Fact Sheet for Patients:  StrictlyIdeas.no Fact Sheet for Healthcare  Providers: BankingDealers.co.za This test is not yet approved or cleared by the [place] FDA and has been authorized for detection and/or diagnosis of SARS-CoV-2 by FDA under an Emergency Use Authorization (EUA).  This EUA will remain in effect (meaning this test can be used) for the duration of the COVID-19 declaration under Section 564(b)(1) of the Act, 21 U.S.C. section 360bbb-3(b)(1), unless the authorization is terminated or revoked sooner. Performed at [facility], [place] [address], [place], [place] [address]   POC occult blood, ED RN will collect     Status: None   Collection Time: 10/01/18  6:06 PM  Result Value Ref Range   Fecal Occult Bld NEGATIVE NEGATIVE  I-STAT 7, (LYTES, BLD GAS, ICA, H+H)     Status: Abnormal   Collection Time: 10/01/18  6:26 PM  Result Value Ref Range   pH, Arterial 7.454 (H) 7.350 - 7.450   pCO2 arterial 28.0 (L) 32.0 - 48.0 mmHg   pO2, Arterial 79.0 (L) 83.0 - 108.0 mmHg   Bicarbonate 19.6 (L) 20.0 - 28.0 mmol/L   TCO2 20 (L) 22 - 32 mmol/L   O2 Saturation 96.0 %   Acid-base deficit 3.0 (H) 0.0 - 2.0 mmol/L   Sodium 141 135 - 145 mmol/L   Potassium 3.9 3.5 - 5.1 mmol/L   Calcium, Ion 1.22 1.15 - 1.40 mmol/L   HCT 32.0 (L) 39.0 - 52.0 %   Hemoglobin 10.9 (L) 13.0 - 17.0 g/dL   Patient temperature HIDE    Collection site RADIAL, ALLEN'S TEST ACCEPTABLE    Drawn by RT    Sample type ARTERIAL   Lactic acid, plasma     Status: Abnormal   Collection Time: 10/01/18  9:49 PM  Result Value Ref Range   Lactic Acid, Venous 2.7 (HH) 0.5 - 1.9 mmol/L    Comment: CRITICAL RESULT CALLED TO, READ BACK BY AND VERIFIED WITH: C Candon Caras RN 10/01/2018 AT 2240 BY H SOEWARDIMAN MT Performed at [facility], [place] [address], [place], [place] [address]   Lipase, blood     Status: None   Collection Time: 10/02/18  2:41 AM  Result Value Ref Range   Lipase 16 11 - 51 U/L    Comment: Performed at [facility], [place]  [address] [place], [place] [address]  Lactic acid, plasma     Status: Abnormal   Collection Time: 10/02/18  2:41 AM  Result Value Ref Range   Lactic Acid, Venous 2.4 (HH) 0.5 - 1.9 mmol/L    Comment: CRITICAL RESULT CALLED TO, READ BACK BY AND VERIFIED WITH: Kayman Snuffer RN 10/02/2018 AT 0324 BY Willy Eddy MT Performed at [facility], [place] [address] [place], [place] [id number]   Basic metabolic panel     Status: Abnormal   Collection Time: 10/02/18  2:41 AM  Result Value Ref Range   Sodium 139 135 - 145 mmol/L   Potassium 4.6 3.5 - 5.1 mmol/L   Chloride 105 98 - 111 mmol/L   CO2 22 22 - 32 mmol/L   Glucose, Bld 154 (H) 70 - 99 mg/dL   BUN 20 8 - 23 mg/dL   Creatinine, Ser 1.60 (H) 0.61 - 1.24 mg/dL   Calcium 8.8 (L) 8.9 - 10.3 mg/dL   GFR calc non Af Amer 38 (L) >60 mL/min   GFR calc Af Amer 44 (L) >60 mL/min   Anion gap 12 5 - 15    Comment: Performed at [facility] [address] [place], [place] [address]  CBC     Status: Abnormal   Collection Time: 10/02/18  2:41 AM  Result Value Ref Range   WBC 9.7 4.0 - 10.5 K/uL   RBC 3.44 (L) 4.22 - 5.81 MIL/uL   Hemoglobin 10.6 (L) 13.0 - 17.0 g/dL   HCT 31.9 (L) 39.0 - 52.0 %   MCV 92.7 80.0 - 100.0 fL   MCH 30.8 26.0 - 34.0 pg   MCHC 33.2 30.0 - 36.0 g/dL   RDW 13.6 11.5 - 15.5 %   Platelets 143 (L) 150 - 400 K/uL   nRBC 0.0 0.0 - 0.2 %    Comment: Performed at [facility], [place] [address] [place], [place] [address]   Ct Abdomen Pelvis Wo Contrast  Result Date: 10/01/2018 CLINICAL DATA:  Vomiting and fever. Sepsis. EXAM: CT ABDOMEN AND PELVIS WITHOUT CONTRAST TECHNIQUE: Multidetector CT imaging of the abdomen and pelvis was performed following the standard protocol without IV contrast. COMPARISON:  April 10, 2018 FINDINGS: Lower chest: Calcific atherosclerotic disease of the coronary arteries is. 1.7 cm focal asymmetry in the slightly lower outer quadrant of the left breast. Bilateral mild gynecomastia. Hepatobiliary:  No focal liver abnormality is seen. No gallstones, gallbladder wall thickening, or biliary dilatation. Pancreas: Unremarkable. No pancreatic ductal dilatation or surrounding inflammatory changes. Spleen: Normal in size without focal abnormality. Adrenals/Urinary Tract: Adrenal glands are unremarkable. Kidneys are normal, without renal calculi, focal lesion, or hydronephrosis. Bladder is unremarkable. Stomach/Bowel: Stomach is within normal limits. No evidence of appendicitis. No evidence of bowel wall thickening, distention, or inflammatory changes. Left colonic diverticulosis without evidence of diverticulitis. Vascular/Lymphatic: Aortic atherosclerosis. No enlarged abdominal or pelvic lymph nodes. Reproductive: Prostate is  unremarkable. Other: No abdominal wall hernia or abnormality. No abdominopelvic ascites. Musculoskeletal: Sacral spine spondylosis. IMPRESSION: 1. No evidence of acute abnormalities within the solid abdominal organs. 2. Left colonic diverticulosis without evidence of diverticulitis. 3. 1.7 cm focal asymmetry in the slightly lower outer quadrant of the left breast. Correlation with mammography is recommended. 4. Calcific atherosclerotic disease of the coronary arteries and aorta. Electronically Signed   By: Fidela Salisbury M.D.   On: 10/01/2018 21:42   Ct Head Wo Contrast  Result Date: 10/01/2018 CLINICAL DATA:  Confusion EXAM: CT HEAD WITHOUT CONTRAST TECHNIQUE: Contiguous axial images were obtained from the base of the skull through the vertex without intravenous contrast. COMPARISON:  None. FINDINGS: Motion degraded images. Brain: No evidence of acute infarction, hemorrhage, hydrocephalus, extra-axial collection or mass lesion/mass effect. Mild cortical atrophy. Subcortical white matter and periventricular small vessel ischemic changes. Vascular: Intracranial atherosclerosis. Skull: Normal. Negative for fracture or focal lesion. Sinuses/Orbits: Partial opacification of the bilateral  ethmoid sinuses. Visualized paranasal sinuses and mastoid air cells are otherwise clear. Other: None. IMPRESSION: Motion degraded images. No evidence of acute intracranial abnormality. Mild atrophy with small vessel ischemic changes. Electronically Signed   By: Julian Hy M.D.   On: 10/01/2018 21:28   Dg Chest Port 1 View  Result Date: 10/01/2018 CLINICAL DATA:  Fever and vomiting. EXAM: PORTABLE CHEST 1 VIEW COMPARISON:  April 10, 2018 FINDINGS: Heart size is stable. Aortic calcifications are again noted. There is no pneumothorax. No large pleural effusion. No focal infiltrate. There is likely atelectasis versus scarring at the lung bases bilaterally. There is some height loss of several mid to lower thoracic vertebral bodies which appears stable from prior studies. IMPRESSION: No active disease. Electronically Signed   By: Constance Holster M.D.   On: 10/01/2018 17:17    Pending Labs Unresulted Labs (From admission, onward)    Start     Ordered   10/02/18 0225  Lactic acid, plasma  STAT Now then every 2 hours,   STAT     10/02/18 0225   10/01/18 1624  Urine culture  ONCE - STAT,   STAT     10/01/18 1624   10/01/18 1553  Culture, blood (Routine x 2)  BLOOD CULTURE X 2,   STAT     10/01/18 1553          Vitals/Pain Today's Vitals   10/02/18 0100 10/02/18 0145 10/02/18 0211 10/02/18 0255  BP: (!) 144/94 (!) 144/82 (!) 147/79 (!) 166/88  Pulse:   (!) 32 (!) 104  Resp: (!) 22 (!) 23 (!) 22 (!) 33  Temp:    100.3 F (37.9 C)  TempSrc:    Rectal  SpO2:   100% 100%  Weight:      Height:        Isolation Precautions No active isolations  Medications Medications  ceFEPIme (MAXIPIME) 2 g in sodium chloride 0.9 % 100 mL IVPB (has no administration in time range)  metroNIDAZOLE (FLAGYL) IVPB 500 mg (0 mg Intravenous Stopped 10/02/18 0355)  ondansetron (ZOFRAN) injection 4 mg (has no administration in time range)  0.9 %  sodium chloride infusion ( Intravenous New Bag/Given  10/02/18 0251)  hydrALAZINE (APRESOLINE) injection 5 mg (has no administration in time range)  enoxaparin (LOVENOX) injection 30 mg (has no administration in time range)  acetaminophen (TYLENOL) tablet 650 mg (has no administration in time range)    Or  acetaminophen (TYLENOL) suppository 650 mg (has no administration in time range)  famotidine (  PEPCID) IVPB 20 mg premix (20 mg Intravenous Not Given 10/02/18 0212)  ondansetron [facility]) injection 4 mg (4 mg Intravenous Given 10/01/18 1715)  acetaminophen (TYLENOL) suppository 650 mg (650 mg Rectal Given 10/01/18 1630)  ceFEPIme (MAXIPIME) 2 g in sodium chloride 0.9 % 100 mL IVPB (0 g Intravenous Stopped 10/01/18 1807)  metroNIDAZOLE (FLAGYL) IVPB 500 mg (0 mg Intravenous Stopped 10/01/18 1910)  sodium chloride 0.9 % bolus 1,000 mL (0 mLs Intravenous Stopped 10/01/18 2137)  sodium chloride 0.9 % bolus 1,836 mL (0 mL/kg  61.2 kg Intravenous Stopped 10/01/18 2137)    Mobility non-ambulatory High fall risk   Focused Assessments Pulmonary Assessment Handoff:  Lung sounds:   O2 Device: Room Air        R Recommendations: See Admitting Provider Note  Report given to:   Additional Notes: pt has condom cath. Has been incontinent of stool. IV abx and fluids

## 2018-10-02 NOTE — Progress Notes (Signed)
CRITICAL VALUE ALERT  Critical Value:  Lactic acid 2.1  Date & Time Notied:  10/02/2018 @ [id number]   Provider Notified: Ghimire   Orders Received/Actions taken:

## 2018-10-02 NOTE — Progress Notes (Signed)
CSW called patient's son to compete assessment- CSW unable to reach patient's son. CSW did not leave message due to not sure if that is the correct number. CSW will call back tomorrow.  Thurmond Butts, MSW, [place] Social Worker [phone]

## 2018-10-03 LAB — CBC WITH DIFFERENTIAL/PLATELET
Abs Immature Granulocytes: 0.1 10*3/uL — ABNORMAL HIGH (ref 0.00–0.07)
Basophils Absolute: 0 10*3/uL (ref 0.0–0.1)
Basophils Relative: 0 %
Eosinophils Absolute: 0 10*3/uL (ref 0.0–0.5)
Eosinophils Relative: 0 %
HCT: 29 % — ABNORMAL LOW (ref 39.0–52.0)
Hemoglobin: 9.8 g/dL — ABNORMAL LOW (ref 13.0–17.0)
Immature Granulocytes: 1 %
Lymphocytes Relative: 13 %
Lymphs Abs: 1.2 10*3/uL (ref 0.7–4.0)
MCH: 30.4 pg (ref 26.0–34.0)
MCHC: 33.8 g/dL (ref 30.0–36.0)
MCV: 90.1 fL (ref 80.0–100.0)
Monocytes Absolute: 0.8 10*3/uL (ref 0.1–1.0)
Monocytes Relative: 8 %
Neutro Abs: 7.6 10*3/uL (ref 1.7–7.7)
Neutrophils Relative %: 78 %
Platelets: 135 10*3/uL — ABNORMAL LOW (ref 150–400)
RBC: 3.22 MIL/uL — ABNORMAL LOW (ref 4.22–5.81)
RDW: 13.2 % (ref 11.5–15.5)
WBC: 9.7 10*3/uL (ref 4.0–10.5)
nRBC: 0 % (ref 0.0–0.2)

## 2018-10-03 LAB — BASIC METABOLIC PANEL
Anion gap: 10 (ref 5–15)
BUN: 16 mg/dL (ref 8–23)
CO2: 22 mmol/L (ref 22–32)
Calcium: 8.8 mg/dL — ABNORMAL LOW (ref 8.9–10.3)
Chloride: 104 mmol/L (ref 98–111)
Creatinine, Ser: 1.39 mg/dL — ABNORMAL HIGH (ref 0.61–1.24)
GFR calc Af Amer: 52 mL/min — ABNORMAL LOW (ref >60)
GFR calc non Af Amer: 45 mL/min — ABNORMAL LOW (ref >60)
Glucose, Bld: 159 mg/dL — ABNORMAL HIGH (ref 70–99)
Potassium: 3.8 mmol/L (ref 3.5–5.1)
Sodium: 136 mmol/L (ref 135–145)

## 2018-10-03 LAB — MAGNESIUM: Magnesium: 1.8 mg/dL (ref 1.7–2.4)

## 2018-10-03 MED ORDER — FERROUS SULFATE 325 (65 FE) MG PO TABS
325.0000 mg | ORAL_TABLET | Freq: Two times a day (BID) | ORAL | Status: DC
  Administered 2018-10-03 – 2018-10-05 (×5): 325 mg via ORAL
  Filled 2018-10-03 (×5): qty 1

## 2018-10-03 MED ORDER — LORAZEPAM 0.5 MG PO TABS
0.5000 mg | ORAL_TABLET | Freq: Two times a day (BID) | ORAL | Status: DC | PRN
  Filled 2018-10-03: qty 1

## 2018-10-03 MED ORDER — DOCUSATE SODIUM 100 MG PO CAPS
100.0000 mg | ORAL_CAPSULE | Freq: Every day | ORAL | Status: DC
  Administered 2018-10-03: 100 mg via ORAL
  Filled 2018-10-03: qty 1

## 2018-10-03 MED ORDER — DIVALPROEX SODIUM 125 MG PO CSDR
250.0000 mg | DELAYED_RELEASE_CAPSULE | Freq: Two times a day (BID) | ORAL | Status: DC
  Administered 2018-10-03 – 2018-10-05 (×5): 250 mg via ORAL
  Filled 2018-10-03 (×7): qty 2

## 2018-10-03 MED ORDER — AMLODIPINE BESYLATE 5 MG PO TABS
5.0000 mg | ORAL_TABLET | Freq: Every day | ORAL | Status: DC
  Administered 2018-10-03 – 2018-10-05 (×3): 5 mg via ORAL
  Filled 2018-10-03 (×3): qty 1

## 2018-10-03 NOTE — Evaluation (Signed)
Physical Therapy Evaluation Patient Details Name: Raymond Hickman MRN: [id number] DOB: 1929/05/15 Today's Date: 10/03/2018   History of Present Illness  85 yo admitted from ALF with N/V with sepsis. PMhx; advanced dementia, HTN, GERD, anxiety, DDD, anemia  Clinical Impression  Pt supine with eyes closed on arrival with some verbal response but incomprehensible when name called. Pt slow to respond but able to pivot to EOb and perform limited gait with RW and min assist. Pt with small amount of BM on bed and during gait took pt to bathroom without awareness of need to void or how to enter to sit on commode with toileting plan abandoned. Pt requires assist for all transfers and mobility and unable to provide PLOF. Pt with decreased strength, function, cognition and gait who will benefit from trial of acute therapy to maximize mobility.  HR 98 with gait    Follow Up Recommendations SNF;Supervision/Assistance - 24 hour    Equipment Recommendations  None recommended by PT    Recommendations for Other Services       Precautions / Restrictions Precautions Precautions: Fall      Mobility  Bed Mobility Overal bed mobility: Needs Assistance Bed Mobility: Supine to Sit     Supine to sit: Min assist;HOB elevated     General bed mobility comments: HOB 25 degrees with min assist to initiate pivot to EOB with pt able to elevate trunk from surface with increased time  Transfers Overall transfer level: Needs assistance   Transfers: Sit to/from Stand Sit to Stand: Min assist         General transfer comment: min assist to rise from bed with hands on stabilized RW, min assist to control descent to surface with decreased ability to turn fully to back to surface without max cues and mod assist to physically turn to surface  Ambulation/Gait Ambulation/Gait assistance: Min assist Gait Distance (Feet): 100 Feet Assistive device: Rolling walker (2 wheeled) Gait Pattern/deviations:  Step-through pattern;Decreased stride length;Trunk flexed;Narrow base of support   Gait velocity interpretation: >2.62 ft/sec, indicative of community ambulatory General Gait Details: cues for sequence with assist to initiate gait and direct RW  Stairs            Wheelchair Mobility    Modified Rankin (Stroke Patients Only)       Balance Overall balance assessment: Needs assistance   Sitting balance-Leahy Scale: Fair Sitting balance - Comments: pt able to sit EOB without UE support   Standing balance support: Bilateral upper extremity supported Standing balance-Leahy Scale: Poor Standing balance comment: bil UE support with RW for gait                             Pertinent Vitals/Pain Pain Assessment: (PAINAD= 0)    Home Living Family/patient expects to be discharged to:: Skilled nursing facility                 Additional Comments: pt unable to provide PLOF and per chart was ambulatory at ALF prior to admission    Prior Function                 Hand Dominance        Extremity/Trunk Assessment   Upper Extremity Assessment Upper Extremity Assessment: Generalized weakness    Lower Extremity Assessment Lower Extremity Assessment: Generalized weakness    Cervical / Trunk Assessment Cervical / Trunk Assessment: Kyphotic  Communication   Communication: Expressive difficulties;Receptive difficulties(advanced dementia)  Cognition Arousal/Alertness: Awake/alert Behavior During Therapy: Flat affect Overall Cognitive Status: No family/caregiver present to determine baseline cognitive functioning                                 General Comments: pt awake and follows visual commands grossly 75% of the time, no demonstration for ability to follow verbal commands. Pt with mumbled speech and generally incomprehensible with periodic clear statements "I guess so", " move over"      General Comments      Exercises      Assessment/Plan    PT Assessment Patient needs continued PT services  PT Problem List Decreased strength;Decreased mobility;Decreased activity tolerance;Decreased balance;Decreased knowledge of use of DME;Decreased cognition       PT Treatment Interventions DME instruction;Therapeutic activities;Cognitive remediation;Gait training;Functional mobility training    PT Goals (Current goals can be found in the Care Plan section)  Acute Rehab PT Goals PT Goal Formulation: Patient unable to participate in goal setting Time For Goal Achievement: 10/17/18 Potential to Achieve Goals: Poor    Frequency Min 2X/week   Barriers to discharge Decreased caregiver support      Co-evaluation               AM-PAC PT "6 Clicks" Mobility  Outcome Measure Help needed turning from your back to your side while in a flat bed without using bedrails?: A Little Help needed moving from lying on your back to sitting on the side of a flat bed without using bedrails?: A Little Help needed moving to and from a bed to a chair (including a wheelchair)?: A Little Help needed standing up from a chair using your arms (e.g., wheelchair or bedside chair)?: A Little Help needed to walk in hospital room?: A Little Help needed climbing 3-5 steps with a railing? : A Lot 6 Click Score: 17    End of Session Equipment Utilized During Treatment: Gait belt Activity Tolerance: Patient tolerated treatment well Patient left: in chair;with call bell/phone within reach;with chair alarm set Nurse Communication: Mobility status;Precautions PT Visit Diagnosis: Other abnormalities of gait and mobility (R26.89);Muscle weakness (generalized) (M62.81);Unsteadiness on feet (R26.81)    Time: [id number] PT Time Calculation (min) (ACUTE ONLY): 26 min   Charges:   PT Evaluation $PT Eval Moderate Complexity: 1 Mod PT Treatments $Gait Training: 8-22 mins        Isla Sabree Pam Drown, PT Acute Rehabilitation Services Pager:  [phone] Office: [place] 10/03/2018, 1:10 PM

## 2018-10-03 NOTE — Evaluation (Signed)
Occupational Therapy Evaluation Patient Details Name: Raymond Hickman MRN: [id number] DOB: 03/05/1929 Today's Date: 10/03/2018    History of Present Illness 85 yo admitted from ALF with N/V with sepsis. PMhx; advanced dementia, HTN, GERD, anxiety, DDD, anemia   Clinical Impression   Pt admitted with the above diagnoses and presents with below problem list. Pt will benefit from continued acute OT to address the below listed deficits and maximize independence with basic ADLs prior to d/c to venue below. PLOF from chart indicates pt was ambulating, needing some assist with ADLs. Pt lethargic during OT session, decreased alertness and initiation with max verbal and tactile cueing provided. Volitional movements observed (ex bringing hand to forehead). Pt needing +2 mod- max A for pericare in standing and to transfer from recliner to EOB. Noted that this is significiantly more assist than needed during PT eval this morning per chart review. Pt did open eyes and verbalize some phrases during pericare in standing. Also, of note pt intermittently, actively scooting down in recliner upon arrival of OT despite legs being elevated and supported on pillow. Pt appeared to be trying to achieve full supine position with decreased safety awareness.     Follow Up Recommendations  SNF    Equipment Recommendations  Other (comment)(defer to next venue)    Recommendations for Other Services       Precautions / Restrictions Precautions Precautions: Fall Restrictions Weight Bearing Restrictions: No      Mobility Bed Mobility Overal bed mobility: Needs Assistance Bed Mobility: Sit to Supine     Supine to sit: Min assist;HOB elevated Sit to supine: Mod assist;HOB elevated   General bed mobility comments: assist to advance BLE onto bed and guard trunk descent  Transfers Overall transfer level: Needs assistance Equipment used: Rolling walker (2 wheeled) Transfers: Sit to/from [employer] Sit to Stand: Mod assist;+2 physical assistance Stand pivot transfers: Mod assist;+2 physical assistance       General transfer comment: Lethargic but eyes open sometimes in standing. Bed brought to pt in lieu of backwards walking which was diddifuclt for pt to initiate. Pt did sidestep along EOB then sat suddenly, assist to control descent    Balance Overall balance assessment: Needs assistance   Sitting balance-Leahy Scale: Fair Sitting balance - Comments: pt able to sit EOB without UE support   Standing balance support: Bilateral upper extremity supported Standing balance-Leahy Scale: Poor Standing balance comment: bil UE support with RW for gait                           ADL either performed or assessed with clinical judgement   ADL Overall ADL's : Needs assistance/impaired Eating/Feeding: Total assistance   Grooming: Total assistance   Upper Body Bathing: Total assistance   Lower Body Bathing: Total assistance   Upper Body Dressing : Total assistance   Lower Body Dressing: Total assistance   Toilet Transfer: Moderate assistance;Stand-pivot;Minimal assistance;BSC;RW   Toileting- Clothing Manipulation and Hygiene: Total assistance;Maximal assistance;Moderate assistance;+2 for physical assistance;Sit to/from stand Toileting - Clothing Manipulation Details (indicate cue type and reason): +1 min to mod A for standing balance, second person for total assist to complete pericare       General ADL Comments: Lethargic. Noted to be sliding out of recliner upon arrival. Ultimately +2 mod A to pivot recliner>EOB.     Vision         Perception     Praxis  Pertinent Vitals/Pain Pain Assessment: (PAINAD= 0)     Hand Dominance     Extremity/Trunk Assessment Upper Extremity Assessment Upper Extremity Assessment: Generalized weakness   Lower Extremity Assessment Lower Extremity Assessment: Generalized weakness;Defer to PT evaluation    Cervical / Trunk Assessment Cervical / Trunk Assessment: Kyphotic   Communication Communication Communication: Expressive difficulties;Receptive difficulties(advanced dementia)   Cognition Arousal/Alertness: Lethargic Behavior During Therapy: Flat affect Overall Cognitive Status: History of cognitive impairments - at baseline                                 General Comments: Pt lethargic in chair, eyes remaining closed, occasional mumble with max verbal and tactile cueing..Volitional movements noted  such as placing hand to rest on forehead.    General Comments       Exercises     Shoulder Instructions      Home Living Family/patient expects to be discharged to:: Skilled nursing facility                                 Additional Comments: pt unable to provide PLOF and per chart was ambulatory at ALF prior to admission      Prior Functioning/Environment          Comments: pt unable to provide PLOF and per chart was ambulatory at ALF prior to admission, did need some assist with ADLs        OT Problem List: Decreased strength;Decreased activity tolerance;Impaired balance (sitting and/or standing);Decreased cognition;Decreased coordination;Decreased safety awareness;Decreased knowledge of use of DME or AE;Decreased knowledge of precautions;Pain      OT Treatment/Interventions: Self-care/ADL training;Therapeutic exercise;Energy conservation;DME and/or AE instruction;Therapeutic activities;Cognitive remediation/compensation;Balance training;Patient/family education    OT Goals(Current goals can be found in the care plan section) Acute Rehab OT Goals OT Goal Formulation: Patient unable to participate in goal setting Time For Goal Achievement: 10/17/18 Potential to Achieve Goals: Good ADL Goals Pt Will Perform Upper Body Bathing: with mod assist;sitting Pt Will Perform Lower Body Bathing: with mod assist;sit to/from stand Pt Will Perform Upper  Body Dressing: with mod assist;sitting Pt Will Perform Lower Body Dressing: with mod assist;sit to/from stand Pt Will Transfer to Toilet: with min assist;ambulating Pt Will Perform Toileting - Clothing Manipulation and hygiene: with min assist;sit to/from stand  OT Frequency: Min 2X/week   Barriers to D/C:            Co-evaluation              AM-PAC OT "6 Clicks" Daily Activity     Outcome Measure Help from another person eating meals?: A Lot Help from another person taking care of personal grooming?: Total Help from another person toileting, which includes using toliet, bedpan, or urinal?: A Lot Help from another person bathing (including washing, rinsing, drying)?: Total Help from another person to put on and taking off regular upper body clothing?: Total Help from another person to put on and taking off regular lower body clothing?: Total 6 Click Score: 8   End of Session Equipment Utilized During Treatment: Gait belt;Rolling walker Nurse Communication: Other (comment)(NT assisted with transfer/mobility)  Activity Tolerance: Patient limited by lethargy Patient left: in bed;with call bell/phone within reach;with bed alarm set  OT Visit Diagnosis: Unsteadiness on feet (R26.81);Muscle weakness (generalized) (M62.81);Other symptoms and signs involving cognitive function;Cognitive communication deficit (R41.841);Pain;Other abnormalities of gait and mobility (R26.89)  Time: [id number] OT Time Calculation (min): 30 min Charges:  OT General Charges $OT Visit: 1 Visit OT Evaluation $OT Eval Moderate Complexity: 1 Mod OT Treatments $Self Care/Home Management : 8-22 mins  Tyrone Schimke, OT Acute Rehabilitation Services Pager: [phone] Office: [phone]   Hortencia Pilar 10/03/2018, 2:09 PM

## 2018-10-03 NOTE — Progress Notes (Signed)
CSW called patient's sons Dwayne and Darryl and left voice message to contact CSW.   CSW called [facility] - no answer - voice mail was full- unable to leave message.   Thurmond Butts, MSW, [place] Social Worker [phone]

## 2018-10-03 NOTE — TOC Initial Note (Addendum)
Transition of Care [facility]) - Initial/Assessment Note    Patient Details  Name: Raymond Hickman MRN: [id number] Date of Birth: 1929/12/24  Transition of Care [facility]) CM/SW Contact:    Vinie Sill, [place] Phone Number: 10/03/2018, 4:13 PM  Clinical Narrative:                  CSW spoke with the patient's son, Darryl. Patient's son states the patient is from [facility] and that he will return there at discharge. Family states no questions or concerns at this time.  CSW called [facility] to inform the patient may discharge tomorrow. CSW was advised to call main number at [phone] and fax discharge summary to [phone]. Per [employer] [place], another covid is not needed. CSW will continue to follow and assist with discharge needs.  Thurmond Butts, MSW, [facility] Clinical Social Worker [phone]    Expected Discharge Plan: Memory Care Barriers to Discharge: Continued Medical Work up   Patient Goals and CMS Choice Patient states their goals for this hospitalization and ongoing recovery are:: to return to ALF      Expected Discharge Plan and Services Expected Discharge Plan: Memory Care       Living arrangements for the past 2 months: [place]                                      Prior Living Arrangements/Services Living arrangements for the past 2 months: [place] Lives with:: Self Patient language and need for interpreter reviewed:: Yes        Need for Family Participation in Patient Care: Yes (Comment) Care giver support system in place?: Yes (comment)   Criminal Activity/Legal Involvement Pertinent to Current Situation/Hospitalization: No - Comment as needed  Activities of Daily Living      Permission Sought/Granted Permission sought to share information with : Family Supports Permission granted to share information with : Yes, Verbal Permission Granted  Share Information with NAME: Velvet Bathe and Darryl Arseneau  Permission granted to share info w AGENCY: [employer] granted to share info w Relationship: sons  Permission granted to share info w Contact Information: Dwayne [phone] and Darryl [phone]  Emotional Assessment   Attitude/Demeanor/Rapport: Unable to Assess Affect (typically observed): Unable to Assess Orientation: : Oriented to Self Alcohol / Substance Use: Not Applicable Psych Involvement: No (comment)  Admission diagnosis:  Sepsis with acute renal failure, due to unspecified organism, unspecified acute renal failure type, unspecified whether septic shock present ([place]) [A41.9, R65.20, N17.9] Patient Active Problem List   Diagnosis Date Noted  . Sepsis ([place]) 10/01/2018  . Acute metabolic encephalopathy [id number]  . Acute renal failure superimposed on stage 3 chronic kidney disease ([place]) 10/01/2018  . Nausea & vomiting 10/01/2018  . Pneumonia 04/10/2018  . Skin rash   . GERD with stricture 12/04/2010  . Atypical lymphocytosis 07/22/2010  . GERD 12/23/2009  . GENERALIZED OSTEOARTHROSIS UNSPECIFIED SITE 12/23/2009  . Dementia ([place]) 09/15/2009  . DEGENERATIVE JOINT DISEASE 08/01/2009  . WEIGHT LOSS 08/01/2009  . NECK PAIN 06/10/2009  . CHEST PAIN UNSPECIFIED 06/10/2009  . ALLERGIC RHINITIS 03/17/2009  . HTN (hypertension) 03/17/2009   PCP:  Dixie Dials, MD Pharmacy:  No Pharmacies Listed    Social Determinants of Health (SDOH) Interventions    Readmission Risk Interventions No flowsheet data found.

## 2018-10-03 NOTE — Progress Notes (Signed)
  Speech Language Pathology Treatment: Dysphagia  Patient Details Name: Raymond Hickman MRN: [id number] DOB: 1929-11-16 Today's Date: 10/03/2018 Time: [id number] SLP Time Calculation (min) (ACUTE ONLY): 14 min  Assessment / Plan / Recommendation Clinical Impression  Patient seen for f/u diet tolerance assessment s/p swallow evaluation complete 8/10. Patient alert, repositioned by SLP to maximize awareness and safety with po intake. Patient able to self feed with min-moderate clinician cueing (visual/tactile) to facilitate awareness of surroundings and sustained attention to task. Patient able to consume pureed solids, regular texture solids, and thin liquids without overt indication of aspiration. Oral transit time mild-moderately delayed with regular texture solids however with extra time, patient able to fully clear oral cavity. Recommend diet be upgraded to dysphagia 3 (mechanical soft), thin liquids. SLP will f/u briefly for tolerance. Episodic aspiration risk does remain mild-moderate given altered mentation and h/o GER.    HPI HPI: Raymond Hickman is a 85 y.o. male with medical history significant of hypertension, GERD, advanced dementia, anxiety, DDD, iron deficiency anemia, who presents with nausea, vomiting and fever.  Pt admitted for sepsis. CXR 8/9 revealed no active disease.  Head CT 8/9 with no acute findings.      SLP Plan  Continue with current plan of care       Recommendations  Diet recommendations: Dysphagia 3 (mechanical soft);Thin liquid Liquids provided via: Cup;Straw Medication Administration: Crushed with puree Supervision: Staff to assist with self feeding;Full supervision/cueing for compensatory strategies Compensations: Minimize environmental distractions;Slow rate;Small sips/bites Postural Changes and/or Swallow Maneuvers: Seated upright 90 degrees;Upright 30-60 min after meal                Oral Care Recommendations: Oral care BID Follow up  Recommendations: Skilled Nursing facility(brief f/u to ensure correct diet is ordered/provided) SLP Visit Diagnosis: Dysphagia, oral phase (R13.11) Plan: Continue with current plan of care       [place] MA, [place] 10/03/2018, 8:50 AM

## 2018-10-03 NOTE — Progress Notes (Signed)
PROGRESS NOTE    Raymond Hickman  [id number] DOB: 07/13/1929 DOA: 10/01/2018 PCP: Dixie Dials, MD    Brief Narrative:  85 year old male from nursing home with medical history of hypertension, GERD, advanced dementia, anxiety, iron deficiency anemia and possible dysphagia who was brought from nursing home to the emergency room with patient complaining of abdominal pain, nausea and low-grade temperature.  In the emergency room, WBC count was normal.  Lactic acid was 4.4, INR 1.2, COVID-19 negative, FOBT negative, liver function test normal, acute renal failure present, urinalysis normal.  Blood pressures stable with tachycardia.  On room air.  Chest x-ray normal.  CT scan of the head no acute abnormalities.  CT abdomen pelvis not showing any acute findings.  He was treated as sepsis with IV fluid boluses, stabilized.   Assessment & Plan:   Principal Problem:   Sepsis ([employer]) Active Problems:   GERD   Dementia ([place])   HTN (hypertension)   Acute metabolic encephalopathy   Acute renal failure superimposed on stage 3 chronic kidney disease (HCC)   Nausea & vomiting  Sepsis present on admission: With endorgan dysfunction with lactic acidosis and acute renal failure. Primary source unknown.  Chest x-ray with no evidence of infection, he has history of aspiration pneumonia.  CT abdomen pelvis did not show any obvious evidence or source of infection.  Urine was clear. Treated as sepsis with unknown source, possible viral syndrome, possible aspiration pneumonia. Continue cefepime and Flagyl. Patient does not have any evidence of neck rigidity, he is not looking toxic and fever is defervescing, do not suspect meningitis. Seen by speech, started on mechanical soft diet. Lactic acid normalized. All culture data is negative so far.  If no positive data, will discontinue antibiotics tomorrow and discharge to nursing home.  Acute metabolic encephalopathy: Family reported decreased mentation and more  confusion.  Probably due to above.  CT head was normal.  No focal deficit.  We will continue to close monitoring.  This is probably baseline.  Nausea and vomiting: Etiology unclear.  Will monitor.  Symptoms improving.  Continue IV fluid support.  Today eating with no problems.  GERD: On Pepcid IV.  Hypertension: On amlodipine at home.  Was on hold with risk of hypotension.  Will resume.  Acute renal failure superimposed on stage III chronic kidney disease: Baseline creatinine 1.4.  Treated with IV fluids and already improving.  Recheck tomorrow morning.  DVT prophylaxis: SCDs. Code Status: Full code Family Communication: Called and updated patient's son on 10/02/2018.  Today they were not able to pick up the phone. Disposition Plan: Transfer to [facility].  Anticipate SNF tomorrow if stable.   Consultants:   None  Procedures:   None  Antimicrobials:   Cefepime, 10/01/2018  Flagyl, 10/02/2018   Subjective: Patient seen and examined.  No overnight events.  No nausea vomiting.  Was able to eat.  Denies any abdominal pain.  Nursing reported no concerns.  Objective: Vitals:   10/02/18 1929 10/02/18 2352 10/03/18 0450 10/03/18 0728  BP: 138/80 (!) 165/94 (!) 143/88 137/88  Pulse: 100 (!) 101 95 99  Resp: (!) 24 (!) 21 20 20   Temp: 98.3 F (36.8 C) 99.4 F (37.4 C) 98.5 F (36.9 C) 97.9 F (36.6 C)  TempSrc: Oral Oral Axillary Axillary  SpO2: 100% 100% 100% 100%  Weight:      Height:        Intake/Output Summary (Last 24 hours) at 10/03/2018 1307 Last data filed at 10/03/2018 0500 Gross  per 24 hour  Intake 2434.76 ml  Output 1275 ml  Net 1159.76 ml   Filed Weights   10/01/18 1722 10/02/18 0439  Weight: 61.2 kg 58.9 kg    Examination:  General exam: Appears calm and comfortable, chronically sick looking, not in any distress.  Sleepy. Respiratory system: Clear to auscultation. Respiratory effort normal.  No added sounds. Cardiovascular system: S1 & S2 heard, RRR. No  JVD, murmurs, rubs, gallops or clicks. No pedal edema. Gastrointestinal system: Abdomen is nondistended, soft and nontender. No organomegaly or masses felt. Normal bowel sounds heard. Central nervous system: Patient is sleepy.  Alert and awake but not oriented. Extremities: Symmetric 5 x 5 power. Skin: No rashes, lesions or ulcers Psychiatry: Judgement and insight appear impaired.  Flat affect.    Data Reviewed: I have personally reviewed following labs and imaging studies  CBC: Recent Labs  Lab 10/01/18 1435 10/01/18 1826 10/02/18 0241 10/03/18 0329  WBC 6.1  --  9.7 9.7  NEUTROABS 4.7  --   --  7.6  HGB 11.5* 10.9* 10.6* 9.8*  HCT 35.0* 32.0* 31.9* 29.0*  MCV 93.8  --  92.7 90.1  PLT 180  --  143* [id number]*   Basic Metabolic Panel: Recent Labs  Lab 10/01/18 1435 10/01/18 1826 10/02/18 0241 10/03/18 0329  NA 140 141 139 136  K 4.4 3.9 4.6 3.8  CL 102  --  105 104  CO2 23  --  22 22  GLUCOSE 124*  --  154* 159*  BUN 28*  --  20 16  CREATININE 1.75*  --  1.60* 1.39*  CALCIUM 9.8  --  8.8* 8.8*  MG  --   --   --  1.8   GFR: Estimated Creatinine Clearance: 31.2 mL/min (A) (by C-G formula based on SCr of 1.39 mg/dL (H)). Liver Function Tests: Recent Labs  Lab 10/01/18 1435  AST 39  ALT 27  ALKPHOS 64  BILITOT 0.6  PROT 8.4*  ALBUMIN 3.8   Recent Labs  Lab 10/02/18 0241  LIPASE 16   No results for input(s): AMMONIA in the last 168 hours. Coagulation Profile: Recent Labs  Lab 10/01/18 1435  INR 1.2   Cardiac Enzymes: No results for input(s): CKTOTAL, CKMB, CKMBINDEX, TROPONINI in the last 168 hours. BNP (last 3 results) No results for input(s): PROBNP in the last 8760 hours. HbA1C: No results for input(s): HGBA1C in the last 72 hours. CBG: No results for input(s): GLUCAP in the last 168 hours. Lipid Profile: No results for input(s): CHOL, HDL, LDLCALC, TRIG, CHOLHDL, LDLDIRECT in the last 72 hours. Thyroid Function Tests: No results for input(s):  TSH, T4TOTAL, FREET4, T3FREE, THYROIDAB in the last 72 hours. Anemia Panel: No results for input(s): VITAMINB12, FOLATE, FERRITIN, TIBC, IRON, RETICCTPCT in the last 72 hours. Sepsis Labs: Recent Labs  Lab 10/01/18 1435 10/01/18 2149 10/02/18 0241 10/02/18 0836  LATICACIDVEN 4.8* 2.7* 2.4* 2.1*    Recent Results (from the past 240 hour(s))  Culture, blood (Routine x 2)     Status: None (Preliminary result)   Collection Time: 10/01/18  2:10 PM   Specimen: BLOOD LEFT WRIST  Result Value Ref Range Status   Specimen Description BLOOD LEFT WRIST  Final   Special Requests   Final    BOTTLES DRAWN AEROBIC ONLY Blood Culture results may not be optimal due to an inadequate volume of blood received in culture bottles   Culture   Final    NO GROWTH < 24 HOURS Performed  at [facility], [place] [address], [place], [place] [address]    Report Status PENDING  Incomplete  Culture, blood (Routine x 2)     Status: None (Preliminary result)   Collection Time: 10/01/18  2:35 PM   Specimen: BLOOD  Result Value Ref Range Status   Specimen Description BLOOD RIGHT ANTECUBITAL  Final   Special Requests   Final    BOTTLES DRAWN AEROBIC AND ANAEROBIC Blood Culture adequate volume   Culture   Final    NO GROWTH < 24 HOURS Performed at [facility], [place] [address], [place], [place] [address]    Report Status PENDING  Incomplete  Urine culture     Status: None   Collection Time: 10/01/18  4:24 PM   Specimen: Urine, Random  Result Value Ref Range Status   Specimen Description URINE, RANDOM  Final   Special Requests NONE  Final   Culture   Final    NO GROWTH Performed at [facility], [place] [address] [place], [place] [address]    Report Status 10/02/2018 FINAL  Final  SARS Coronavirus 2 [facility] order, Performed in [facility] hospital lab) Nasopharyngeal Nasopharyngeal Swab     Status: None   Collection Time: 10/01/18  5:20 PM   Specimen: Nasopharyngeal Swab  Result Value Ref  Range Status   SARS Coronavirus 2 NEGATIVE NEGATIVE Final    Comment: (NOTE) If result is NEGATIVE SARS-CoV-2 target nucleic acids are NOT DETECTED. The SARS-CoV-2 RNA is generally detectable in upper and lower  respiratory specimens during the acute phase of infection. The lowest  concentration of SARS-CoV-2 viral copies this assay can detect is 250  copies / mL. A negative result does not preclude SARS-CoV-2 infection  and should not be used as the sole basis for treatment or other  patient management decisions.  A negative result may occur with  improper specimen collection / handling, submission of specimen other  than nasopharyngeal swab, presence of viral mutation(s) within the  areas targeted by this assay, and inadequate number of viral copies  (<250 copies / mL). A negative result must be combined with clinical  observations, patient history, and epidemiological information. If result is POSITIVE SARS-CoV-2 target nucleic acids are DETECTED. The SARS-CoV-2 RNA is generally detectable in upper and lower  respiratory specimens dur ing the acute phase of infection.  Positive  results are indicative of active infection with SARS-CoV-2.  Clinical  correlation with patient history and other diagnostic information is  necessary to determine patient infection status.  Positive results do  not rule out bacterial infection or co-infection with other viruses. If result is PRESUMPTIVE POSTIVE SARS-CoV-2 nucleic acids MAY BE PRESENT.   A presumptive positive result was obtained on the submitted specimen  and confirmed on repeat testing.  While 2019 novel coronavirus  (SARS-CoV-2) nucleic acids may be present in the submitted sample  additional confirmatory testing may be necessary for epidemiological  and / or clinical management purposes  to differentiate between  SARS-CoV-2 and other Sarbecovirus currently known to infect humans.  If clinically indicated additional testing with an  alternate test  methodology [id number]) is advised. The SARS-CoV-2 RNA is generally  detectable in upper and lower respiratory sp ecimens during the acute  phase of infection. The expected result is Negative. Fact Sheet for Patients:  StrictlyIdeas.no Fact Sheet for Healthcare Providers: BankingDealers.co.za This test is not yet approved or cleared by the [place] FDA and has been authorized for  detection and/or diagnosis of SARS-CoV-2 by FDA under an Emergency Use Authorization (EUA).  This EUA will remain in effect (meaning this test can be used) for the duration of the COVID-19 declaration under Section 564(b)(1) of the Act, 21 U.S.C. section 360bbb-3(b)(1), unless the authorization is terminated or revoked sooner. Performed at [facility], [place] [address] [place], [place] [address]          Radiology Studies: Ct Abdomen Pelvis Wo Contrast  Result Date: 10/01/2018 CLINICAL DATA:  Vomiting and fever. Sepsis. EXAM: CT ABDOMEN AND PELVIS WITHOUT CONTRAST TECHNIQUE: Multidetector CT imaging of the abdomen and pelvis was performed following the standard protocol without IV contrast. COMPARISON:  April 10, 2018 FINDINGS: Lower chest: Calcific atherosclerotic disease of the coronary arteries is. 1.7 cm focal asymmetry in the slightly lower outer quadrant of the left breast. Bilateral mild gynecomastia. Hepatobiliary: No focal liver abnormality is seen. No gallstones, gallbladder wall thickening, or biliary dilatation. Pancreas: Unremarkable. No pancreatic ductal dilatation or surrounding inflammatory changes. Spleen: Normal in size without focal abnormality. Adrenals/Urinary Tract: Adrenal glands are unremarkable. Kidneys are normal, without renal calculi, focal lesion, or hydronephrosis. Bladder is unremarkable. Stomach/Bowel: Stomach is within normal limits. No evidence of appendicitis. No evidence of bowel wall thickening,  distention, or inflammatory changes. Left colonic diverticulosis without evidence of diverticulitis. Vascular/Lymphatic: Aortic atherosclerosis. No enlarged abdominal or pelvic lymph nodes. Reproductive: Prostate is unremarkable. Other: No abdominal wall hernia or abnormality. No abdominopelvic ascites. Musculoskeletal: Sacral spine spondylosis. IMPRESSION: 1. No evidence of acute abnormalities within the solid abdominal organs. 2. Left colonic diverticulosis without evidence of diverticulitis. 3. 1.7 cm focal asymmetry in the slightly lower outer quadrant of the left breast. Correlation with mammography is recommended. 4. Calcific atherosclerotic disease of the coronary arteries and aorta. Electronically Signed   By: Fidela Salisbury M.D.   On: 10/01/2018 21:42   Ct Head Wo Contrast  Result Date: 10/01/2018 CLINICAL DATA:  Confusion EXAM: CT HEAD WITHOUT CONTRAST TECHNIQUE: Contiguous axial images were obtained from the base of the skull through the vertex without intravenous contrast. COMPARISON:  None. FINDINGS: Motion degraded images. Brain: No evidence of acute infarction, hemorrhage, hydrocephalus, extra-axial collection or mass lesion/mass effect. Mild cortical atrophy. Subcortical white matter and periventricular small vessel ischemic changes. Vascular: Intracranial atherosclerosis. Skull: Normal. Negative for fracture or focal lesion. Sinuses/Orbits: Partial opacification of the bilateral ethmoid sinuses. Visualized paranasal sinuses and mastoid air cells are otherwise clear. Other: None. IMPRESSION: Motion degraded images. No evidence of acute intracranial abnormality. Mild atrophy with small vessel ischemic changes. Electronically Signed   By: Julian Hy M.D.   On: 10/01/2018 21:28   Dg Chest Port 1 View  Result Date: 10/01/2018 CLINICAL DATA:  Fever and vomiting. EXAM: PORTABLE CHEST 1 VIEW COMPARISON:  April 10, 2018 FINDINGS: Heart size is stable. Aortic calcifications are again  noted. There is no pneumothorax. No large pleural effusion. No focal infiltrate. There is likely atelectasis versus scarring at the lung bases bilaterally. There is some height loss of several mid to lower thoracic vertebral bodies which appears stable from prior studies. IMPRESSION: No active disease. Electronically Signed   By: Constance Holster M.D.   On: 10/01/2018 17:17        Scheduled Meds: Continuous Infusions: . sodium chloride 100 mL/hr at 10/03/18 0500  . ceFEPime (MAXIPIME) IV 2 g (10/02/18 1651)  . famotidine (PEPCID) IV 20 mg (10/03/18 1043)  . metronidazole 500 mg (10/03/18 0916)     LOS: 2 days  Time spent: 25 minutes.    Barb Merino, MD [facility] Pager [phone]  If 7PM-7AM, please contact night-coverage [email or web address] Password [facility] 10/03/2018, 1:07 PM

## 2018-10-04 ENCOUNTER — Other Ambulatory Visit: Payer: Self-pay

## 2018-10-04 LAB — CBC WITH DIFFERENTIAL/PLATELET
Abs Immature Granulocytes: 0.03 10*3/uL (ref 0.00–0.07)
Basophils Absolute: 0 10*3/uL (ref 0.0–0.1)
Basophils Relative: 0 %
Eosinophils Absolute: 0 10*3/uL (ref 0.0–0.5)
Eosinophils Relative: 1 %
HCT: 29.5 % — ABNORMAL LOW (ref 39.0–52.0)
Hemoglobin: 10.1 g/dL — ABNORMAL LOW (ref 13.0–17.0)
Immature Granulocytes: 0 %
Lymphocytes Relative: 13 %
Lymphs Abs: 1.1 10*3/uL (ref 0.7–4.0)
MCH: 31.2 pg (ref 26.0–34.0)
MCHC: 34.2 g/dL (ref 30.0–36.0)
MCV: 91 fL (ref 80.0–100.0)
Monocytes Absolute: 1 10*3/uL (ref 0.1–1.0)
Monocytes Relative: 12 %
Neutro Abs: 6.3 10*3/uL (ref 1.7–7.7)
Neutrophils Relative %: 74 %
Platelets: 148 10*3/uL — ABNORMAL LOW (ref 150–400)
RBC: 3.24 MIL/uL — ABNORMAL LOW (ref 4.22–5.81)
RDW: 13.3 % (ref 11.5–15.5)
WBC: 8.5 10*3/uL (ref 4.0–10.5)
nRBC: 0 % (ref 0.0–0.2)

## 2018-10-04 LAB — BASIC METABOLIC PANEL
Anion gap: 11 (ref 5–15)
BUN: 16 mg/dL (ref 8–23)
CO2: 21 mmol/L — ABNORMAL LOW (ref 22–32)
Calcium: 8.9 mg/dL (ref 8.9–10.3)
Chloride: 103 mmol/L (ref 98–111)
Creatinine, Ser: 1.25 mg/dL — ABNORMAL HIGH (ref 0.61–1.24)
GFR calc Af Amer: 60 mL/min — ABNORMAL LOW (ref >60)
GFR calc non Af Amer: 51 mL/min — ABNORMAL LOW (ref >60)
Glucose, Bld: 138 mg/dL — ABNORMAL HIGH (ref 70–99)
Potassium: 3.6 mmol/L (ref 3.5–5.1)
Sodium: 135 mmol/L (ref 135–145)

## 2018-10-04 LAB — VALPROIC ACID LEVEL: Valproic Acid Lvl: 44 ug/mL — ABNORMAL LOW (ref 50.0–100.0)

## 2018-10-04 MED ORDER — DOCUSATE SODIUM 50 MG/5ML PO LIQD
100.0000 mg | Freq: Every day | ORAL | Status: DC
  Administered 2018-10-04 – 2018-10-05 (×2): 100 mg via ORAL
  Filled 2018-10-04 (×2): qty 10

## 2018-10-04 NOTE — TOC Progression Note (Signed)
Transition of Care [facility]) - Progression Note    Patient Details  Name: Raymond Hickman MRN: [id number] Date of Birth: [birth year]  Transition of Care [facility]) CM/SW [place], [place] Phone Number: 10/04/2018, 3:49 PM  Clinical Narrative:     CSW called [facility] to advise the patient will not discharge today, maybe tomorrow. When the patient is medically ready for discharge, ALF needs the discharge summary as soon as possible to review medications needed.  Thurmond Butts, MSW, [facility] Clinical Social Worker [phone]   Expected Discharge Plan: Memory Care Barriers to Discharge: Continued Medical Work up  Expected Discharge Plan and Services Expected Discharge Plan: [place] arrangements for the past 2 months: [place]                                       Social Determinants of Health (SDOH) Interventions    Readmission Risk Interventions No flowsheet data found.

## 2018-10-04 NOTE — Plan of Care (Signed)
  Problem: Education: Goal: Knowledge of General Education information will improve Description: Including pain rating scale, medication(s)/side effects and non-pharmacologic comfort measures Outcome: Completed/Met   Problem: Coping: Goal: Level of anxiety will decrease Outcome: Completed/Met   Problem: Elimination: Goal: Will not experience complications related to bowel motility Outcome: Completed/Met Goal: Will not experience complications related to urinary retention Outcome: Completed/Met   Problem: Pain Managment: Goal: General experience of comfort will improve Outcome: Completed/Met   Problem: Safety: Goal: Ability to remain free from injury will improve Outcome: Completed/Met   Problem: Skin Integrity: Goal: Risk for impaired skin integrity will decrease Outcome: Completed/Met

## 2018-10-04 NOTE — Progress Notes (Signed)
PROGRESS NOTE  Raymond Hickman [id number] DOB: 01/16/1930 DOA: 10/01/2018 PCP: Dixie Dials, MD  Brief History   85 year old black male CKD 4, metabolic encephalopathy in the setting of dementia, HTN, hypoglycemia, negative stress test 02/2010 reflux, recent hospitalization 04/10/2018-04/12/2018 aspiration pneumonia Returns to the hospital 10/01/2018 with nausea vomiting fever-history obtained from family as patient has severe dementia-T-max 101.5-lactic acid 4.4 blood pressure elevated tachycardic satting well on room air CTs abdomen chest pelvis negative for source-not felt to be meningitis on admission  A & P  Sepsis?  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 AKI superimposed on chronic kidney disease stage III-IV Creatinine much improved-saline lock as above-still has some acidosis probably re-equilibration-becoming slightly hyperchloremic-increase free water 1.7 cm breast mass Picked up coincidentally on CT abdomen pelvis-patient is clearly not a candidate for biopsy/treatment given his very severe and advanced dementia Reflux, HTN-stable-resume only necessary meds  DVT prophylaxis: Lovenox Code Status: Full Family Communication: called d-i-l Mardene Celeste and updated fully Disposition Plan: memory care in am    Verneita Griffes, MD [facility] 4:52 PM  10/04/2018, 4:52 PM  LOS: 3 days   Consultants  . n  Procedures  . n  Antibiotics  . n  Interval History/Subjective   Awake but not coherent  Reacts to internal stimuli-untouched food plate  Objective   Vitals:  Vitals:   10/04/18 0423 10/04/18 1018  BP: (!) 144/72 (!) 156/98  Pulse: 94 96  Resp: 19 15  Temp: 98.5 F (36.9 C) 98 F (36.7 C)  SpO2: 97% 100%   Exam:  Unable to orient cta b no added sound abd soft n tnd no rebound no gaurding No le edema No thrush Neuro intact  I have personally reviewed the following:   Lab Data  . co2 21 . Bun/creat--->16/1.25  Micro Data  . Blood culture x 2 NGTD . Urine Culture  Scheduled Meds: . amLODipine  5 mg Oral Daily  . divalproex  250 mg Oral BID  . docusate  100 mg Oral Daily  . ferrous sulfate  325 mg Oral BID WC   Continuous Infusions: . famotidine (PEPCID) IV 20 mg (10/04/18 1222)    Principal Problem:   Sepsis ([place]) Active Problems:   GERD   Dementia ([place])   HTN (hypertension)   Acute metabolic encephalopathy   Acute renal failure superimposed on stage 3 chronic kidney disease (HCC)   Nausea & vomiting   LOS: 3 days   How to contact the [facility] Attending or Consulting provider [place] or covering provider during after hours [place], for this patient?  1. Check the care team in [facility] and look for a) attending/consulting TRH provider listed and b) the [facility] team listed 2. Log into [email or web address] and use [place]'s universal password to access. If you do not have the password, please contact the hospital operator. 3. Locate the [facility] provider you are looking for under [facility] and page to a number that you can be directly reached. 4. If you still have difficulty reaching the  provider, please page the [facility] (Director on Call) for the Hospitalists listed on amion for assistance. Was not cooperative for

## 2018-10-05 LAB — BASIC METABOLIC PANEL
Anion gap: 13 (ref 5–15)
BUN: 15 mg/dL (ref 8–23)
CO2: 22 mmol/L (ref 22–32)
Calcium: 9.1 mg/dL (ref 8.9–10.3)
Chloride: 100 mmol/L (ref 98–111)
Creatinine, Ser: 1.24 mg/dL (ref 0.61–1.24)
GFR calc Af Amer: >60 mL/min (ref >60)
GFR calc non Af Amer: 52 mL/min — ABNORMAL LOW (ref >60)
Glucose, Bld: 116 mg/dL — ABNORMAL HIGH (ref 70–99)
Potassium: 3.3 mmol/L — ABNORMAL LOW (ref 3.5–5.1)
Sodium: 135 mmol/L (ref 135–145)

## 2018-10-05 MED ORDER — FAMOTIDINE 20 MG PO TABS
20.0000 mg | ORAL_TABLET | Freq: Every day | ORAL | Status: DC
  Administered 2018-10-05: 20 mg via ORAL
  Filled 2018-10-05: qty 1

## 2018-10-05 MED ORDER — DIVALPROEX SODIUM 125 MG PO CSDR: 250.0000 mg | DELAYED_RELEASE_CAPSULE | Freq: Two times a day (BID) | ORAL | 0 refills | Status: AC

## 2018-10-05 MED ORDER — LORAZEPAM 0.5 MG PO TABS: 0.5000 mg | ORAL_TABLET | Freq: Two times a day (BID) | ORAL | 0 refills | Status: AC | PRN

## 2018-10-05 MED ORDER — POTASSIUM CHLORIDE CRYS ER 20 MEQ PO TBCR
40.0000 meq | EXTENDED_RELEASE_TABLET | Freq: Every day | ORAL | Status: DC
  Administered 2018-10-05: 09:00:00 40 meq via ORAL
  Filled 2018-10-05: qty 2

## 2018-10-05 NOTE — Discharge Summary (Signed)
Physician Discharge Summary  Raymond Hickman [id number] DOB: 11/01/1929 DOA: 10/01/2018  PCP: Dixie Dials, MD  Admit date: 10/01/2018 Discharge date: 10/05/2018  Time spent: 22 minutes  Recommendations for Outpatient Follow-up:  1. cmet and cbc at [facility] 1 week 2. Rec Dys 2 diet aND RE-EVAL FROM slp AT FACILITY  3. REC GOALS OF CARE AS op---VERY HIGH RISK FOR READMIT--I did NOT discuss 1.7 cm breast mass--I doubt he is a good candidate for biopsy--please consider discussion of this as OP if clinically makes a miraculous recover  Discharge Diagnoses:  Principal Problem:   Sepsis ([place]) Active Problems:   GERD   Dementia ([place])   HTN (hypertension)   Acute metabolic encephalopathy   Acute renal failure superimposed on stage 3 chronic kidney disease (HCC)   Nausea & vomiting   Discharge Condition: gaurded  Diet recommendation: dys 2  Filed Weights   10/01/18 1722 10/02/18 0439  Weight: 61.2 kg 53.84 kg    85 year old black male CKD 4, metabolic encephalopathy in the setting of dementia, HTN, hypoglycemia, negative stress test 02/2010 reflux, recent hospitalization 04/10/2018-04/12/2018 aspiration pneumonia Returns to the hospital 10/01/2018 with nausea vomiting fever-history obtained from family as patient has severe dementia-T-max 101.5-lactic acid 4.4 blood pressure elevated tachycardic satting well on room air CTs abdomen chest pelvis negative for source-not felt to be meningitis on admission  A & P  Sepsis?  Pneumonitis versus viral syndrome-not coronavirus Admission in February = pneumonia-completed empiric antibiotics 8/12-stabilized but high risk aspiraiton OP settting DC IV saline Acute metabolic encephalopathy superimposed on stage VII dementia Patient is a feeder and cannot do much on his own-he will be able to go back to memory unit Speech saw the patient in consult: recommending dysphagia 2 thin liquid diet Reorient as able-Ativan 0.5 twice daily as needed agitation,  Depakote 250 twice daily Rx given on d/c AKI superimposed on chronic kidney disease stage III-IV Creatinine much improved-saline lock as above-still has some acidosis probably re-equilibration-becoming slightly hyperchloremic-increase free water if able at SNF 1.7 cm breast mass Picked up coincidentally on CT abdomen pelvis-patient is clearly not a candidate for biopsy/treatment given his very severe and advanced dementia Reflux, HTN-stable-resume only necessary meds    Discharge Exam: Vitals:   10/05/18 0821 10/05/18 1201  BP: (!) 130/91 140/90  Pulse:  (!) 104  Resp: (!) 23 (!) 24  Temp:  98.5 F (36.9 C)  SpO2:  100%    General: asleepy no distress awakens but then goes back to sleep Cardiovascular: s1 s2 no m/r/g Respiratory: cta b no adde dsound no rales no rhonchi Frail cachectic  Discharge Instructions   Discharge Instructions    Diet - low sodium heart healthy   Complete by: As directed    Increase activity slowly   Complete by: As directed    Increase activity slowly   Complete by: As directed      Allergies as of 10/05/2018      Reactions   Omeprazole    GIB bleeding per his daughter in-law (10/01/18 reported)      Medication List    STOP taking these medications   clotrimazole 1 % cream Commonly known as: LOTRIMIN     TAKE these medications   acetaminophen 325 MG tablet Commonly known as: TYLENOL Take 650 mg by mouth 3 (three) times daily as needed for mild pain or moderate pain.   amLODipine 5 MG tablet Commonly known as: NORVASC Take 5 mg by mouth daily.   divalproex 125  MG capsule Commonly known as: DEPAKOTE SPRINKLE Take 2 capsules (250 mg total) by mouth 2 (two) times daily.   docusate sodium 100 MG capsule Commonly known as: COLACE Take 100 mg by mouth daily.   ferrous sulfate 325 (65 FE) MG tablet Take 325 mg by mouth 2 (two) times daily with a meal.   LORazepam 0.5 MG tablet Commonly known as: ATIVAN Take 1 tablet (0.5 mg  total) by mouth 2 (two) times daily as needed (for Agitation).   omeprazole 20 MG capsule Commonly known as: PRILOSEC Take 1 capsule (20 mg total) by mouth daily. What changed: when to take this   Vitamin D (Ergocalciferol) 1.25 MG (50000 UT) Caps capsule Commonly known as: DRISDOL Take 50,000 Units by mouth every 7 (seven) days.      Allergies  Allergen Reactions  . Omeprazole     GIB bleeding per his daughter in-law (10/01/18 reported)      The results of significant diagnostics from this hospitalization (including imaging, microbiology, ancillary and laboratory) are listed below for reference.    Significant Diagnostic Studies: Ct Abdomen Pelvis Wo Contrast  Result Date: 10/01/2018 CLINICAL DATA:  Vomiting and fever. Sepsis. EXAM: CT ABDOMEN AND PELVIS WITHOUT CONTRAST TECHNIQUE: Multidetector CT imaging of the abdomen and pelvis was performed following the standard protocol without IV contrast. COMPARISON:  April 10, 2018 FINDINGS: Lower chest: Calcific atherosclerotic disease of the coronary arteries is. 1.7 cm focal asymmetry in the slightly lower outer quadrant of the left breast. Bilateral mild gynecomastia. Hepatobiliary: No focal liver abnormality is seen. No gallstones, gallbladder wall thickening, or biliary dilatation. Pancreas: Unremarkable. No pancreatic ductal dilatation or surrounding inflammatory changes. Spleen: Normal in size without focal abnormality. Adrenals/Urinary Tract: Adrenal glands are unremarkable. Kidneys are normal, without renal calculi, focal lesion, or hydronephrosis. Bladder is unremarkable. Stomach/Bowel: Stomach is within normal limits. No evidence of appendicitis. No evidence of bowel wall thickening, distention, or inflammatory changes. Left colonic diverticulosis without evidence of diverticulitis. Vascular/Lymphatic: Aortic atherosclerosis. No enlarged abdominal or pelvic lymph nodes. Reproductive: Prostate is unremarkable. Other: No abdominal wall  hernia or abnormality. No abdominopelvic ascites. Musculoskeletal: Sacral spine spondylosis. IMPRESSION: 1. No evidence of acute abnormalities within the solid abdominal organs. 2. Left colonic diverticulosis without evidence of diverticulitis. 3. 1.7 cm focal asymmetry in the slightly lower outer quadrant of the left breast. Correlation with mammography is recommended. 4. Calcific atherosclerotic disease of the coronary arteries and aorta. Electronically Signed   By: Fidela Salisbury M.D.   On: 10/01/2018 21:42   Ct Head Wo Contrast  Result Date: 10/01/2018 CLINICAL DATA:  Confusion EXAM: CT HEAD WITHOUT CONTRAST TECHNIQUE: Contiguous axial images were obtained from the base of the skull through the vertex without intravenous contrast. COMPARISON:  None. FINDINGS: Motion degraded images. Brain: No evidence of acute infarction, hemorrhage, hydrocephalus, extra-axial collection or mass lesion/mass effect. Mild cortical atrophy. Subcortical white matter and periventricular small vessel ischemic changes. Vascular: Intracranial atherosclerosis. Skull: Normal. Negative for fracture or focal lesion. Sinuses/Orbits: Partial opacification of the bilateral ethmoid sinuses. Visualized paranasal sinuses and mastoid air cells are otherwise clear. Other: None. IMPRESSION: Motion degraded images. No evidence of acute intracranial abnormality. Mild atrophy with small vessel ischemic changes. Electronically Signed   By: Julian Hy M.D.   On: 10/01/2018 21:28   Dg Chest Port 1 View  Result Date: 10/01/2018 CLINICAL DATA:  Fever and vomiting. EXAM: PORTABLE CHEST 1 VIEW COMPARISON:  April 10, 2018 FINDINGS: Heart size is stable. Aortic calcifications are again  noted. There is no pneumothorax. No large pleural effusion. No focal infiltrate. There is likely atelectasis versus scarring at the lung bases bilaterally. There is some height loss of several mid to lower thoracic vertebral bodies which appears stable from  prior studies. IMPRESSION: No active disease. Electronically Signed   By: Constance Holster M.D.   On: 10/01/2018 17:17    Microbiology: Recent Results (from the past 240 hour(s))  Culture, blood (Routine x 2)     Status: None (Preliminary result)   Collection Time: 10/01/18  2:10 PM   Specimen: BLOOD LEFT WRIST  Result Value Ref Range Status   Specimen Description BLOOD LEFT WRIST  Final   Special Requests   Final    BOTTLES DRAWN AEROBIC ONLY Blood Culture results may not be optimal due to an inadequate volume of blood received in culture bottles   Culture   Final    NO GROWTH 4 DAYS Performed at [facility], [place] [address], [place], [place] [address]    Report Status PENDING  Incomplete  Culture, blood (Routine x 2)     Status: None (Preliminary result)   Collection Time: 10/01/18  2:35 PM   Specimen: BLOOD  Result Value Ref Range Status   Specimen Description BLOOD RIGHT ANTECUBITAL  Final   Special Requests   Final    BOTTLES DRAWN AEROBIC AND ANAEROBIC Blood Culture adequate volume   Culture   Final    NO GROWTH 4 DAYS Performed at [facility], [place] [address] [place], [place] [address]    Report Status PENDING  Incomplete  Urine culture     Status: None   Collection Time: 10/01/18  4:24 PM   Specimen: Urine, Random  Result Value Ref Range Status   Specimen Description URINE, RANDOM  Final   Special Requests NONE  Final   Culture   Final    NO GROWTH Performed at [facility], [place] [address], [place],  [address]    Report Status 10/02/2018 FINAL  Final  SARS Coronavirus 2 [facility] order, Performed in [facility] hospital lab) Nasopharyngeal Nasopharyngeal Swab     Status: None   Collection Time: 10/01/18  5:20 PM   Specimen: Nasopharyngeal Swab  Result Value Ref Range Status   SARS Coronavirus 2 NEGATIVE NEGATIVE Final    Comment: (NOTE) If result is NEGATIVE SARS-CoV-2 target nucleic acids are NOT DETECTED. The SARS-CoV-2 RNA is  generally detectable in upper and lower  respiratory specimens during the acute phase of infection. The lowest  concentration of SARS-CoV-2 viral copies this assay can detect is 250  copies / mL. A negative result does not preclude SARS-CoV-2 infection  and should not be used as the sole basis for treatment or other  patient management decisions.  A negative result may occur with  improper specimen collection / handling, submission of specimen other  than nasopharyngeal swab, presence of viral mutation(s) within the  areas targeted by this assay, and inadequate number of viral copies  (<250 copies / mL). A negative result must be combined with clinical  observations, patient history, and epidemiological information. If result is POSITIVE SARS-CoV-2 target nucleic acids are DETECTED. The SARS-CoV-2 RNA is generally detectable in upper and lower  respiratory specimens dur ing the acute phase of infection.  Positive  results are indicative of active infection with SARS-CoV-2.  Clinical  correlation with patient history and other diagnostic information is  necessary to determine patient infection status.  Positive  results do  not rule out bacterial infection or co-infection with other viruses. If result is PRESUMPTIVE POSTIVE SARS-CoV-2 nucleic acids MAY BE PRESENT.   A presumptive positive result was obtained on the submitted specimen  and confirmed on repeat testing.  While 2019 novel coronavirus  (SARS-CoV-2) nucleic acids may be present in the submitted sample  additional confirmatory testing may be necessary for epidemiological  and / or clinical management purposes  to differentiate between  SARS-CoV-2 and other Sarbecovirus currently known to infect humans.  If clinically indicated additional testing with an alternate test  methodology [phone]) is advised. The SARS-CoV-2 RNA is generally  detectable in upper and lower respiratory sp ecimens during the acute  phase of  infection. The expected result is Negative. Fact Sheet for Patients:  StrictlyIdeas.no Fact Sheet for Healthcare Providers: BankingDealers.co.za This test is not yet approved or cleared by the [place] FDA and has been authorized for detection and/or diagnosis of SARS-CoV-2 by FDA under an Emergency Use Authorization (EUA).  This EUA will remain in effect (meaning this test can be used) for the duration of the COVID-19 declaration under Section 564(b)(1) of the Act, 21 U.S.C. section 360bbb-3(b)(1), unless the authorization is terminated or revoked sooner. Performed at [facility], [place] [address] [place], [place] [address]      Labs: Basic Metabolic Panel: Recent Labs  Lab 10/01/18 1435 10/01/18 1826 10/02/18 0241 10/03/18 0329 10/04/18 0417 10/05/18 0455  NA 140 141 139 136 135 135  K 4.4 3.9 4.6 3.8 3.6 3.3*  CL 102  --  105 104 103 100  CO2 23  --  22 22 21* 22  GLUCOSE 124*  --  154* 159* 138* 116*  BUN 28*  --  20 16 16 15   CREATININE 1.75*  --  1.60* 1.39* 1.25* 1.24  CALCIUM 9.8  --  8.8* 8.8* 8.9 9.1  MG  --   --   --  1.8  --   --    Liver Function Tests: Recent Labs  Lab 10/01/18 1435  AST 39  ALT 27  ALKPHOS 64  BILITOT 0.6  PROT 8.4*  ALBUMIN 3.8   Recent Labs  Lab 10/02/18 0241  LIPASE 16   No results for input(s): AMMONIA in the last 168 hours. CBC: Recent Labs  Lab 10/01/18 1435 10/01/18 1826 10/02/18 0241 10/03/18 0329 10/04/18 0417  WBC 6.1  --  9.7 9.7 8.5  NEUTROABS 4.7  --   --  7.6 6.3  HGB 11.5* 10.9* 10.6* 9.8* 10.1*  HCT 35.0* 32.0* 31.9* 29.0* 29.5*  MCV 93.8  --  92.7 90.1 91.0  PLT 180  --  143* 135* 148*   Cardiac Enzymes: No results for input(s): CKTOTAL, CKMB, CKMBINDEX, TROPONINI in the last 168 hours. BNP: BNP (last 3 results) No results for input(s): BNP in the last 8760 hours.  ProBNP (last 3 results) No results for input(s): PROBNP in the last 8760  hours.  CBG: No results for input(s): GLUCAP in the last 168 hours.     Signed:  Nita Sells MD   [facility] 10/05/2018, 2:06 PM

## 2018-10-05 NOTE — Progress Notes (Signed)
  Speech Language Pathology Treatment: Dysphagia  Patient Details Name: Raymond Hickman MRN: [id number] DOB: 03-Apr-1929 Today's Date: 10/05/2018 Time: 4827-0786 SLP Time Calculation (min) (ACUTE ONLY): 20 min  Assessment / Plan / Recommendation Clinical Impression  Patient asleep when entering room. He woke to his name. Repositioned pt for in bed upright for lunch. Lunch tray presented (Dys 3, thin). He tolerated all consistencies well. He had prolonged mastication with meats and corn, however he effectively chewed all consistencies to swallow. His intake was reduced. He only ate 25% of meal.  Consider downgrading diet to see if patient's intake increases. ST to sign off.    HPI HPI: Raymond Hickman is a 85 y.o. male with medical history significant of hypertension, GERD, advanced dementia, anxiety, DDD, iron deficiency anemia, who presents with nausea, vomiting and fever.  Pt admitted for sepsis. CXR 8/9 revealed no active disease.  Head CT 8/9 with no acute findings.      SLP Plan  All goals met       Recommendations  Diet recommendations: Dysphagia 3 (mechanical soft);Thin liquid Liquids provided via: Cup Medication Administration: Crushed with puree Supervision: Staff to assist with self feeding;Full supervision/cueing for compensatory strategies Compensations: Minimize environmental distractions;Slow rate;Small sips/bites Postural Changes and/or Swallow Maneuvers: Seated upright 90 degrees;Upright 30-60 min after meal                Oral Care Recommendations: Oral care BID Follow up Recommendations: Skilled Nursing facility SLP Visit Diagnosis: Dysphagia, oral phase (R13.11) Plan: All goals met       [place], MA, CCC-SLP 10/05/2018 2:10 PM

## 2018-10-05 NOTE — Care Management Important Message (Signed)
Important Message  Patient Details  Name: BENICIO LARRANAGA MRN: [id number] Date of Birth: 1929/09/14   Medicare Important Message Given:        Shelda Altes 10/05/2018, 12:31 PM

## 2018-10-05 NOTE — Progress Notes (Signed)
Patient report called to [facility]. Gudelia Eugene, Bettina Gavia RN

## 2018-10-05 NOTE — TOC Transition Note (Signed)
Transition of Care [facility]) - CM/SW Discharge Note   Patient Details  Name: KYNAN BEHNEN MRN: [id number] Date of Birth: [birth year]  Transition of Care [facility]) CM/SW Contact:  Vinie Sill, [place] Phone Number: 10/05/2018, 3:21 PM   Clinical Narrative:     Patient will DC to: [place] Date: 10/05/2018 Family Notified: Darryl, son  Transport [id number] @ 4:30pm  RN, patient's family, and facility notified of DC. Discharge Summary sent to facility. Facility states no FL2 is needed and gave permission to send the patient. RN given number for [phone]. Ambulance transport requested for patient.   Clinical Social Worker signing off. Thurmond Butts, MSW, [facility] Clinical Social Worker [phone]   Final next level of care: Memory Care Barriers to Discharge: No Barriers Identified   Patient Goals and CMS Choice Patient states their goals for this hospitalization and ongoing recovery are:: family- patient to return to [facility]      Discharge Placement              Patient chooses bed at: [facility]) Patient to be transferred to facility by: [place] Name of family member notified: Tresea Mall, son Patient and family notified of of transfer: 10/05/18  Discharge Plan and Services                                     Social Determinants of Health (SDOH) Interventions     Readmission Risk Interventions No flowsheet data found.

## 2018-10-06 LAB — CULTURE, BLOOD (ROUTINE X 2)
Culture: NO GROWTH
Culture: NO GROWTH
Special Requests: ADEQUATE

## 2018-10-09 ENCOUNTER — Emergency Department (HOSPITAL_COMMUNITY)
Admission: EM | Admit: 2018-10-09 | Discharge: 2018-10-10 | Disposition: A | Discharge status: Home / Self Care | Payer: Medicare HMO | Attending: Emergency Medicine | Admitting: Emergency Medicine

## 2018-10-09 ENCOUNTER — Other Ambulatory Visit: Payer: Self-pay

## 2018-10-09 ENCOUNTER — Emergency Department (HOSPITAL_COMMUNITY): Payer: Medicare HMO

## 2018-10-09 DIAGNOSIS — Z79899 Other long term (current) drug therapy: Secondary | ICD-10-CM | POA: Insufficient documentation

## 2018-10-09 DIAGNOSIS — I10 Essential (primary) hypertension: Secondary | ICD-10-CM | POA: Diagnosis not present

## 2018-10-09 DIAGNOSIS — R4182 Altered mental status, unspecified: Secondary | ICD-10-CM | POA: Diagnosis present

## 2018-10-09 DIAGNOSIS — F0390 Unspecified dementia without behavioral disturbance: Secondary | ICD-10-CM | POA: Insufficient documentation

## 2018-10-09 DIAGNOSIS — R404 Transient alteration of awareness: Secondary | ICD-10-CM | POA: Insufficient documentation

## 2018-10-09 LAB — COMPREHENSIVE METABOLIC PANEL
ALT: 17 U/L (ref 0–44)
AST: 27 U/L (ref 15–41)
Albumin: 3.5 g/dL (ref 3.5–5.0)
Alkaline Phosphatase: 53 U/L (ref 38–126)
Anion gap: 11 (ref 5–15)
BUN: 42 mg/dL — ABNORMAL HIGH (ref 8–23)
CO2: 24 mmol/L (ref 22–32)
Calcium: 9.2 mg/dL (ref 8.9–10.3)
Chloride: 102 mmol/L (ref 98–111)
Creatinine, Ser: 1.42 mg/dL — ABNORMAL HIGH (ref 0.61–1.24)
GFR calc Af Amer: 51 mL/min — ABNORMAL LOW (ref >60)
GFR calc non Af Amer: 44 mL/min — ABNORMAL LOW (ref >60)
Glucose, Bld: 134 mg/dL — ABNORMAL HIGH (ref 70–99)
Potassium: 3.7 mmol/L (ref 3.5–5.1)
Sodium: 137 mmol/L (ref 135–145)
Total Bilirubin: 0.7 mg/dL (ref 0.3–1.2)
Total Protein: 8.2 g/dL — ABNORMAL HIGH (ref 6.5–8.1)

## 2018-10-09 LAB — CBC WITH DIFFERENTIAL/PLATELET
Abs Immature Granulocytes: 0.04 10*3/uL (ref 0.00–0.07)
Basophils Absolute: 0 10*3/uL (ref 0.0–0.1)
Basophils Relative: 0 %
Eosinophils Absolute: 0.1 10*3/uL (ref 0.0–0.5)
Eosinophils Relative: 1 %
HCT: 33 % — ABNORMAL LOW (ref 39.0–52.0)
Hemoglobin: 10.9 g/dL — ABNORMAL LOW (ref 13.0–17.0)
Immature Granulocytes: 1 %
Lymphocytes Relative: 22 %
Lymphs Abs: 1.7 10*3/uL (ref 0.7–4.0)
MCH: 31.1 pg (ref 26.0–34.0)
MCHC: 33 g/dL (ref 30.0–36.0)
MCV: 94 fL (ref 80.0–100.0)
Monocytes Absolute: 0.9 10*3/uL (ref 0.1–1.0)
Monocytes Relative: 11 %
Neutro Abs: 5.1 10*3/uL (ref 1.7–7.7)
Neutrophils Relative %: 65 %
Platelets: 223 10*3/uL (ref 150–400)
RBC: 3.51 MIL/uL — ABNORMAL LOW (ref 4.22–5.81)
RDW: 13.8 % (ref 11.5–15.5)
WBC: 7.8 10*3/uL (ref 4.0–10.5)
nRBC: 0 % (ref 0.0–0.2)

## 2018-10-09 LAB — URINALYSIS, COMPLETE (UACMP) WITH MICROSCOPIC
Bacteria, UA: NONE SEEN
Bilirubin Urine: NEGATIVE
Glucose, UA: NEGATIVE mg/dL
Hgb urine dipstick: NEGATIVE
Ketones, ur: 5 mg/dL — AB
Leukocytes,Ua: NEGATIVE
Nitrite: NEGATIVE
Protein, ur: NEGATIVE mg/dL
Specific Gravity, Urine: 1.018 (ref 1.005–1.030)
Squamous Epithelial / HPF: NONE SEEN (ref 0–5)
pH: 6 (ref 5.0–8.0)

## 2018-10-09 LAB — BLOOD GAS, VENOUS
Acid-Base Excess: 2.5 mmol/L — ABNORMAL HIGH (ref 0.0–2.0)
Bicarbonate: 26.9 mmol/L (ref 20.0–28.0)
Drawn by: 270211
O2 Saturation: 42.1 %
Patient temperature: 98.6
pCO2, Ven: 42.9 mmHg — ABNORMAL LOW (ref 44.0–60.0)
pH, Ven: 7.414 (ref 7.250–7.430)

## 2018-10-09 LAB — AMMONIA: Ammonia: 20 umol/L (ref 9–35)

## 2018-10-09 LAB — CBG MONITORING, ED: Glucose-Capillary: 122 mg/dL — ABNORMAL HIGH (ref 70–99)

## 2018-10-09 MED ORDER — SODIUM CHLORIDE 0.9 % IV SOLN
INTRAVENOUS | Status: DC
  Administered 2018-10-09: 21:00:00 via INTRAVENOUS

## 2018-10-09 MED ORDER — SODIUM CHLORIDE 0.9 % IV BOLUS
500.0000 mL | Freq: Once | INTRAVENOUS | Status: AC
  Administered 2018-10-09: 500 mL via INTRAVENOUS

## 2018-10-09 NOTE — ED Triage Notes (Signed)
Pt to ED via EMS from [facility]. Pt has hx dementia. Staff concerned because pt is not as engaged at the facility.

## 2018-10-09 NOTE — ED Notes (Signed)
EDP at bedside  

## 2018-10-10 MED ORDER — SODIUM CHLORIDE 0.9 % IV BOLUS
1000.0000 mL | Freq: Once | INTRAVENOUS | Status: AC
  Administered 2018-10-10: 1000 mL via INTRAVENOUS

## 2018-10-10 NOTE — ED Notes (Signed)
3rd call to [facility] ok to transport pt back to assisted living via [facility]

## 2018-10-10 NOTE — ED Notes (Signed)
Called son to notify of father's  Discharge, message left on answer machine

## 2018-10-10 NOTE — Discharge Instructions (Signed)
There is no obvious cause for your confusion that was found in the work-up done in the emergency department.  Please contact your family doctor.  Please return to the emergency department for any worsening.

## 2018-10-10 NOTE — ED Provider Notes (Signed)
85 yo M who has been less interactive with the staff than he typically is at his nursing home.  I received the patient in signout from Dr. Tomi Bamberger.  His plan was to get a laboratory evaluation including a urine if this was unremarkable that he would be likely discharged back to the facility.  Patient's urine is come back and is negative for infection.  On my reassessment of the patient his heart rate remains in the 110 range.  He is not consistently interactive with me.  He feels mildly warm to the touch.  We will give another bolus of IV fluids.  We will check a rectal temperature.  The patient's rectal temperature is normal.  He is given another bolus of IV fluids with minimal if any improvement of his heart rate.  I am unsure of the cause of his sinus tachycardia.  He does not have an anion gap.  No significant acidosis.  No anemia.  He appeared dehydrated and was given 1500 cc of IV fluids without significant improvement.  I wonder if some of the tachycardia is due to a baseline agitation and confusion that he has.  At this point I will discharge him as previously planned.  We will have the family have him return for worsening or change in symptoms.   Deno Etienne, DO 10/10/18 0206

## 2018-10-10 NOTE — ED Notes (Signed)
Straight cath urine obtained atraumatic pt tolerated well.

## 2018-10-10 NOTE — ED Notes (Signed)
[facility] called for transport.  

## 2018-10-19 NOTE — ED Provider Notes (Signed)
[facility] DEPT Provider Note   CSN: [id number] Arrival date & time: 10/09/18  1721     History   Chief Complaint Chief Complaint  Patient presents with  . Altered Mental Status    hx dementia    HPI Raymond Hickman is a 85 y.o. male.     HPI Pt presented to the ED for evaluation of a decline in mental status.  Per EMS report, pt is not engaged at the facility.  History is limited.  Pt has dementia, is not able to communicate with me during the ED visit.  Son provided additional information when he arrived.  Son has not seen pt in months because of covid.  He was more alert and communicative when he last saw him but that was months ago.  No known fever, vomiting other complaints. Past Medical History:  Diagnosis Date  . Allergic rhinitis, cause unspecified   . DDD (degenerative disc disease), cervical   . Dementia (HCC)    Mild  . GERD (gastroesophageal reflux disease)   . Hypertension       No past surgical history on file.      Home Medications    Prior to Admission medications   Medication Sig Start Date End Date Taking? Authorizing Provider  acetaminophen (TYLENOL) 325 MG tablet Take 650 mg by mouth 3 (three) times daily as needed for mild pain or moderate pain.    Yes [provider]  amLODipine (NORVASC) 5 MG tablet Take 5 mg by mouth daily.   Yes [provider]  divalproex (DEPAKOTE SPRINKLE) 125 MG capsule Take 2 capsules (250 mg total) by mouth 2 (two) times daily. 10/05/18  Yes Nita Sells, MD  docusate sodium (COLACE) 100 MG capsule Take 100 mg by mouth daily.   Yes [provider]  Ensure (ENSURE) Take 237 mLs by mouth 3 (three) times daily.   Yes [provider]  ferrous sulfate 325 (65 FE) MG tablet Take 325 mg by mouth 2 (two) times daily with a meal.   Yes [provider]  LORazepam (ATIVAN) 0.5 MG tablet Take 1 tablet (0.5 mg total) by mouth 2 (two) times daily as  needed (for Agitation). 10/05/18  Yes Nita Sells, MD  omeprazole (PRILOSEC) 20 MG capsule Take 1 capsule (20 mg total) by mouth daily. Patient taking differently: Take 20 mg by mouth 2 (two) times daily before a meal.  01/22/13  Yes [name], MD  Vitamin D, Ergocalciferol, (DRISDOL) 1.25 MG (50000 UT) CAPS capsule Take 50,000 Units by mouth every 7 (seven) days.   Yes [provider]    Family History Family History  Problem Relation Age of Onset  . Diabetes Mellitus II Son     Social History Social History   Tobacco Use  . Smoking status: Never Smoker  . Smokeless tobacco: Never Used  Substance Use Topics  . Alcohol use: No  . Drug use: No     Allergies   Omeprazole   Review of Systems Review of Systems  All other systems reviewed and are negative.    Physical Exam Updated Vital Signs BP (!) 141/89 (BP Location: Left Arm)   Pulse (!) 110   Temp 98.2 F (36.8 C) (Oral)   Resp 18   SpO2 100%   Physical Exam Vitals signs and nursing note reviewed.  Constitutional:      Appearance: He is well-developed. He is ill-appearing.     Comments: Elderly , frail  HENT:     Head: Normocephalic and atraumatic.     Right Ear: External ear normal.     Left Ear: External ear normal.  Eyes:     General: No scleral icterus.       Right eye: No discharge.        Left eye: No discharge.     Conjunctiva/sclera: Conjunctivae normal.  Neck:     Musculoskeletal: Neck supple.     Trachea: No tracheal deviation.  Cardiovascular:     Rate and Rhythm: Normal rate and regular rhythm.  Pulmonary:     Effort: Pulmonary effort is normal. No respiratory distress.     Breath sounds: Normal breath sounds. No stridor. No wheezing or rales.  Abdominal:     General: Bowel sounds are normal. There is no distension.     Palpations: Abdomen is soft.     Tenderness: There is no abdominal tenderness. There is no guarding or rebound.  Musculoskeletal:         General: No tenderness.  Skin:    General: Skin is warm and dry.     Findings: No rash.  Neurological:     Mental Status: He is alert.     Cranial Nerves: No cranial nerve deficit (no facial droop, extraocular movements intact,  ).     Sensory: No sensory deficit.     Motor: No abnormal muscle tone or seizure activity.     Comments: Eyes open, awake does not follow commands, does not answer questions      ED Treatments / Results  Labs (all labs ordered are listed, but only abnormal results are displayed) Labs Reviewed  CBC WITH DIFFERENTIAL/PLATELET - Abnormal; Notable for the following components:      Result Value   RBC 3.51 (*)    Hemoglobin 10.9 (*)    HCT 33.0 (*)    All other components within normal limits  URINALYSIS, COMPLETE (UACMP) WITH MICROSCOPIC - Abnormal; Notable for the following components:   Ketones, ur 5 (*)    All other components within normal limits  BLOOD GAS, VENOUS - Abnormal; Notable for the following components:   pCO2, Ven 42.9 (*)    Acid-Base Excess 2.5 (*)    All other components within normal limits  COMPREHENSIVE METABOLIC PANEL - Abnormal; Notable for the following components:   Glucose, Bld 134 (*)    BUN 42 (*)    Creatinine, Ser 1.42 (*)    Total Protein 8.2 (*)    GFR calc non Af Amer 44 (*)    GFR calc Af Amer 51 (*)    All other components within normal limits  CBG MONITORING, ED - Abnormal; Notable for the following components:   Glucose-Capillary 122 (*)    All other components within normal limits  AMMONIA    EKG EKG Interpretation  Date/Time:  Monday October 09 2018 18:19:10 EDT Ventricular Rate:  119 PR Interval:    QRS Duration: 72 QT Interval:  321 QTC Calculation: 452 R Axis:   -35 Text Interpretation:  Sinus tachycardia Left axis deviation No significant change since last tracing Confirmed by Dorie Rank [phone]) on 10/09/2018 6:47:55 PM Also confirmed by Dorie Rank [phone]), editor Philomena Doheny [phone])  on 10/10/2018  7:14:40 AM   Radiology No results found.  Procedures Procedures (including critical care time)  Medications Ordered in ED Medications  sodium chloride 0.9 % bolus 500 mL (0 mLs Intravenous Stopped 10/09/18 2045)  sodium chloride 0.9 % bolus 1,000 mL (  0 mLs Intravenous Stopped 10/10/18 0602)     Initial Impression / Assessment and Plan / ED Course  I have reviewed the triage vital signs and the nursing notes.  Pertinent labs & imaging results that were available during my care of the patient were reviewed by me and considered in my medical decision making (see chart for details).  Clinical Course as of Oct 18 749  Mon Oct 09, 2018  2058 Labs reviewed.     [JK]  2058 CBC shows a stable anemia.  BMET with increase in bun and creatinine   [JK]  2101 No acute changes on CT   [JK]  2254 Discharge summary reviewed.  According to the discharge summary at the patient's mental baseline he is not able to do much.  Stage VII dementia per the dc summary.   [JK]    Clinical Course User Index [JK] Dorie Rank, MD     Presented from the nursing home for altered mental status.  Unclear of the onset.  Per family new but last time he was seen was months ago because of COVID.    Also, per recent dc summary pt appears to be at his baseline since last hospitalization.  ED workup reassuring.  No signs of acute CNS event on CT.  No signs of acute infection.  Suspect sx may be related to worsening dementia.  Final Clinical Impressions(s) / ED Diagnoses   Final diagnoses:  Transient alteration of awareness    ED Discharge Orders    None       Dorie Rank, MD 10/19/18 [phone]

## 2018-11-23 DEATH — deceased

## 2020-03-23 IMAGING — DX DG CHEST 1V PORT
1 series · 1 of 1 positions shown · non-contrast
Comparison: None.

CLINICAL DATA: Weakness

EXAM:
PORTABLE CHEST 1 VIEW

[chest ap]
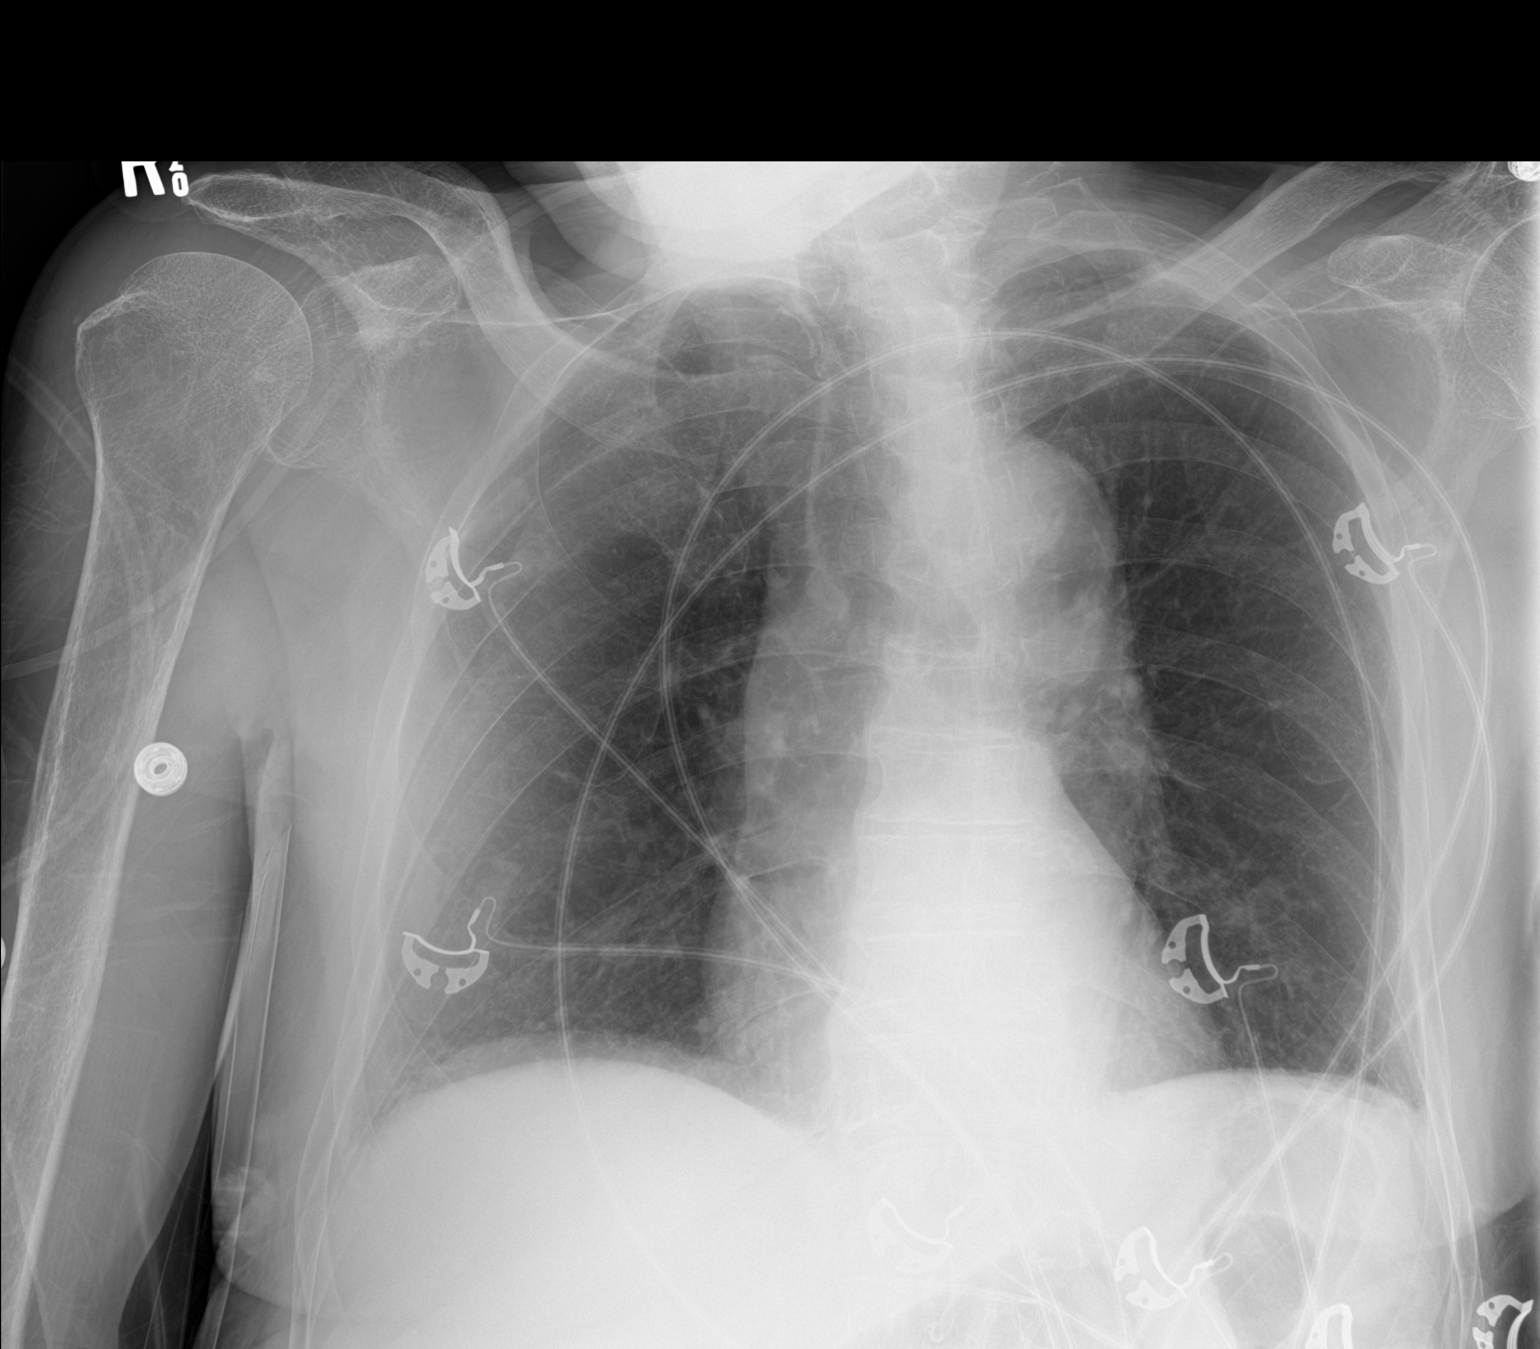

[1 of 1 positions shown; findings below may reference images not displayed]

FINDINGS: The heart size and mediastinal contours are within normal limits.
Both lungs are clear. The visualized skeletal structures are
unremarkable.
IMPRESSION: No active disease.
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
